# Patient Record
Sex: Female | Born: 1960 | Race: White | Hispanic: No | Marital: Married | Smoking: Never smoker
Health system: Southern US, Community
[De-identification: ages and names within clinical notes are randomized; demographics above are authoritative.]

## PROBLEM LIST (undated history)

## (undated) DIAGNOSIS — E079 Disorder of thyroid, unspecified: Secondary | ICD-10-CM

## (undated) DIAGNOSIS — Z8619 Personal history of other infectious and parasitic diseases: Secondary | ICD-10-CM

## (undated) DIAGNOSIS — I10 Essential (primary) hypertension: Secondary | ICD-10-CM

## (undated) DIAGNOSIS — T7840XA Allergy, unspecified, initial encounter: Secondary | ICD-10-CM

## (undated) HISTORY — DX: Allergy, unspecified, initial encounter: T78.40XA

## (undated) HISTORY — DX: Disorder of thyroid, unspecified: E07.9

## (undated) HISTORY — PX: TUBAL LIGATION: SHX77

## (undated) HISTORY — DX: Personal history of other infectious and parasitic diseases: Z86.19

## (undated) HISTORY — DX: Essential (primary) hypertension: I10

## (undated) HISTORY — PX: MOUTH SURGERY: SHX715

---

## 1998-10-01 ENCOUNTER — Other Ambulatory Visit: Admission: RE | Admit: 1998-10-01 | Discharge: 1998-10-01 | Payer: Self-pay | Admitting: Gynecology

## 1999-05-05 ENCOUNTER — Other Ambulatory Visit: Admission: RE | Admit: 1999-05-05 | Discharge: 1999-05-05 | Payer: Self-pay | Admitting: Gynecology

## 1999-05-05 ENCOUNTER — Encounter (INDEPENDENT_AMBULATORY_CARE_PROVIDER_SITE_OTHER): Payer: Self-pay | Admitting: Specialist

## 1999-10-05 ENCOUNTER — Other Ambulatory Visit: Admission: RE | Admit: 1999-10-05 | Discharge: 1999-10-05 | Payer: Self-pay | Admitting: Gynecology

## 2000-09-21 ENCOUNTER — Encounter: Payer: Self-pay | Admitting: Gynecology

## 2000-09-21 ENCOUNTER — Ambulatory Visit (HOSPITAL_COMMUNITY): Admission: RE | Admit: 2000-09-21 | Discharge: 2000-09-21 | Payer: Self-pay | Admitting: Gynecology

## 2000-10-21 ENCOUNTER — Other Ambulatory Visit: Admission: RE | Admit: 2000-10-21 | Discharge: 2000-10-21 | Payer: Self-pay | Admitting: Gynecology

## 2001-10-03 ENCOUNTER — Encounter: Payer: Self-pay | Admitting: Gynecology

## 2001-10-03 ENCOUNTER — Ambulatory Visit (HOSPITAL_COMMUNITY): Admission: RE | Admit: 2001-10-03 | Discharge: 2001-10-03 | Payer: Self-pay | Admitting: Gynecology

## 2001-10-05 ENCOUNTER — Encounter: Payer: Self-pay | Admitting: Gynecology

## 2001-10-05 ENCOUNTER — Encounter: Admission: RE | Admit: 2001-10-05 | Discharge: 2001-10-05 | Payer: Self-pay | Admitting: Gynecology

## 2001-10-24 ENCOUNTER — Other Ambulatory Visit: Admission: RE | Admit: 2001-10-24 | Discharge: 2001-10-24 | Payer: Self-pay | Admitting: Gynecology

## 2002-10-29 ENCOUNTER — Other Ambulatory Visit: Admission: RE | Admit: 2002-10-29 | Discharge: 2002-10-29 | Payer: Self-pay | Admitting: Gynecology

## 2003-04-05 ENCOUNTER — Other Ambulatory Visit: Admission: RE | Admit: 2003-04-05 | Discharge: 2003-04-05 | Payer: Self-pay | Admitting: Gynecology

## 2003-07-03 ENCOUNTER — Encounter: Payer: Self-pay | Admitting: Gynecology

## 2003-07-03 ENCOUNTER — Encounter: Admission: RE | Admit: 2003-07-03 | Discharge: 2003-07-03 | Payer: Self-pay | Admitting: Gynecology

## 2003-11-14 ENCOUNTER — Other Ambulatory Visit: Admission: RE | Admit: 2003-11-14 | Discharge: 2003-11-14 | Payer: Self-pay | Admitting: Gynecology

## 2004-03-17 ENCOUNTER — Encounter: Admission: RE | Admit: 2004-03-17 | Discharge: 2004-03-17 | Payer: Self-pay | Admitting: Gynecology

## 2004-07-06 ENCOUNTER — Encounter: Admission: RE | Admit: 2004-07-06 | Discharge: 2004-07-06 | Payer: Self-pay | Admitting: Gynecology

## 2004-11-17 ENCOUNTER — Other Ambulatory Visit: Admission: RE | Admit: 2004-11-17 | Discharge: 2004-11-17 | Payer: Self-pay | Admitting: Gynecology

## 2005-01-05 ENCOUNTER — Other Ambulatory Visit: Admission: RE | Admit: 2005-01-05 | Discharge: 2005-01-05 | Payer: Self-pay | Admitting: Diagnostic Radiology

## 2005-07-07 ENCOUNTER — Encounter: Admission: RE | Admit: 2005-07-07 | Discharge: 2005-07-07 | Payer: Self-pay | Admitting: Gynecology

## 2005-07-08 ENCOUNTER — Encounter: Admission: RE | Admit: 2005-07-08 | Discharge: 2005-07-08 | Payer: Self-pay | Admitting: Gynecology

## 2005-11-18 ENCOUNTER — Other Ambulatory Visit: Admission: RE | Admit: 2005-11-18 | Discharge: 2005-11-18 | Payer: Self-pay | Admitting: Gynecology

## 2006-07-12 ENCOUNTER — Encounter: Admission: RE | Admit: 2006-07-12 | Discharge: 2006-07-12 | Payer: Self-pay | Admitting: Gynecology

## 2006-07-19 ENCOUNTER — Encounter: Admission: RE | Admit: 2006-07-19 | Discharge: 2006-07-19 | Payer: Self-pay | Admitting: Gynecology

## 2006-12-12 ENCOUNTER — Other Ambulatory Visit: Admission: RE | Admit: 2006-12-12 | Discharge: 2006-12-12 | Payer: Self-pay | Admitting: Gynecology

## 2007-01-26 ENCOUNTER — Encounter: Admission: RE | Admit: 2007-01-26 | Discharge: 2007-01-26 | Payer: Self-pay | Admitting: Gynecology

## 2007-07-20 ENCOUNTER — Encounter: Admission: RE | Admit: 2007-07-20 | Discharge: 2007-07-20 | Payer: Self-pay | Admitting: Gynecology

## 2007-12-15 ENCOUNTER — Other Ambulatory Visit: Admission: RE | Admit: 2007-12-15 | Discharge: 2007-12-15 | Payer: Self-pay | Admitting: Gynecology

## 2008-07-30 ENCOUNTER — Encounter: Admission: RE | Admit: 2008-07-30 | Discharge: 2008-07-30 | Payer: Self-pay | Admitting: Gynecology

## 2009-02-10 ENCOUNTER — Ambulatory Visit: Payer: Self-pay | Admitting: Women's Health

## 2009-02-13 ENCOUNTER — Other Ambulatory Visit: Admission: RE | Admit: 2009-02-13 | Discharge: 2009-02-13 | Payer: Self-pay | Admitting: Gynecology

## 2009-02-13 ENCOUNTER — Encounter: Payer: Self-pay | Admitting: Women's Health

## 2009-02-13 ENCOUNTER — Ambulatory Visit: Payer: Self-pay | Admitting: Women's Health

## 2009-08-26 ENCOUNTER — Encounter: Admission: RE | Admit: 2009-08-26 | Discharge: 2009-08-26 | Payer: Self-pay | Admitting: Gynecology

## 2010-05-26 ENCOUNTER — Other Ambulatory Visit: Admission: RE | Admit: 2010-05-26 | Discharge: 2010-05-26 | Payer: Self-pay | Admitting: Gynecology

## 2010-05-26 ENCOUNTER — Ambulatory Visit: Payer: Self-pay | Admitting: Gynecology

## 2010-09-24 ENCOUNTER — Encounter: Admission: RE | Admit: 2010-09-24 | Discharge: 2010-09-24 | Payer: Self-pay | Admitting: Gynecology

## 2010-10-01 ENCOUNTER — Encounter: Admission: RE | Admit: 2010-10-01 | Discharge: 2010-10-01 | Payer: Self-pay | Admitting: Gynecology

## 2011-09-17 ENCOUNTER — Encounter: Payer: Self-pay | Admitting: Gynecology

## 2011-09-23 ENCOUNTER — Other Ambulatory Visit: Payer: Self-pay | Admitting: Gynecology

## 2011-09-23 DIAGNOSIS — Z1231 Encounter for screening mammogram for malignant neoplasm of breast: Secondary | ICD-10-CM

## 2011-09-28 ENCOUNTER — Encounter: Payer: Self-pay | Admitting: Gynecology

## 2011-09-28 ENCOUNTER — Ambulatory Visit (INDEPENDENT_AMBULATORY_CARE_PROVIDER_SITE_OTHER): Payer: Managed Care, Other (non HMO) | Admitting: Gynecology

## 2011-09-28 VITALS — BP 130/70 | Ht 65.5 in | Wt 168.0 lb

## 2011-09-28 DIAGNOSIS — Z13220 Encounter for screening for lipoid disorders: Secondary | ICD-10-CM

## 2011-09-28 DIAGNOSIS — R82998 Other abnormal findings in urine: Secondary | ICD-10-CM

## 2011-09-28 DIAGNOSIS — Z131 Encounter for screening for diabetes mellitus: Secondary | ICD-10-CM

## 2011-09-28 DIAGNOSIS — Z23 Encounter for immunization: Secondary | ICD-10-CM

## 2011-09-28 DIAGNOSIS — Z01419 Encounter for gynecological examination (general) (routine) without abnormal findings: Secondary | ICD-10-CM

## 2011-09-28 NOTE — Progress Notes (Signed)
Joyce Burns 1961/06/02 [id number]        50 y.o.  for annual exam.  Overall doing well but does note some night sweats. Having regular monthly menses.  Past medical history,surgical history, medications, allergies, family history and social history were all reviewed and documented in the EPIC chart. ROS:  Was performed and pertinent positives and negatives are included in the history.  Exam: chaperone present Filed Vitals:   09/28/11 1512  BP: 130/70   General appearance  Normal Skin grossly normal Head/Neck normal with no cervical or supraclavicular adenopathy thyroid normal Lungs  clear Cardiac RR, without RMG Abdominal  soft, nontender, without masses, organomegaly or hernia Breasts  examined lying and sitting without masses, retractions, discharge or axillary adenopathy. Pelvic  Ext/BUS/vagina  normal   Cervix  normal    Uterus  axial, normal size, shape and contour, midline and mobile nontender   Adnexa  Without masses or tenderness    Anus and perineum  normal   Rectovaginal  normal sphincter tone without palpated masses or tenderness.    Assessment/Plan:  50 y.o. female for annual exam.  Overall doing well. She is having some night sweats. She's having regular menses I doubt this is menopausal related. She is hypothyroid on Synthroid replacement and reports recently having blood work follow up and she is euthyroid.  SBE monthly reviewed. Due for mammogram now she knows to schedule this. No Pap smear was done today as she has no history of abnormal Pap smears, has a number of normal reports in her chart, the last Pap smear being July 2011. She's comfortable with less frequent screening interval and will plan every 3 years. Baseline labs to include CBC glucose lipid profile urinalysis were ordered. Assuming she continues well from a gynecologic standpoint, she will see [place] in a year, sooner as needed.    Dara Lords MD, 4:23 PM 09/28/2011

## 2011-09-29 ENCOUNTER — Encounter: Payer: Self-pay | Admitting: Gynecology

## 2011-10-19 ENCOUNTER — Ambulatory Visit
Admission: RE | Admit: 2011-10-19 | Discharge: 2011-10-19 | Disposition: A | Discharge status: Home / Self Care | Payer: Managed Care, Other (non HMO) | Source: Ambulatory Visit | Attending: Gynecology | Admitting: Gynecology

## 2011-10-19 DIAGNOSIS — Z1231 Encounter for screening mammogram for malignant neoplasm of breast: Secondary | ICD-10-CM

## 2011-10-25 ENCOUNTER — Other Ambulatory Visit: Payer: Self-pay | Admitting: *Deleted

## 2011-10-25 DIAGNOSIS — N63 Unspecified lump in unspecified breast: Secondary | ICD-10-CM

## 2011-10-25 DIAGNOSIS — R928 Other abnormal and inconclusive findings on diagnostic imaging of breast: Secondary | ICD-10-CM

## 2011-10-28 ENCOUNTER — Ambulatory Visit
Admission: RE | Admit: 2011-10-28 | Discharge: 2011-10-28 | Disposition: A | Discharge status: Home / Self Care | Payer: Managed Care, Other (non HMO) | Source: Ambulatory Visit | Attending: Gynecology | Admitting: Gynecology

## 2011-10-28 DIAGNOSIS — R928 Other abnormal and inconclusive findings on diagnostic imaging of breast: Secondary | ICD-10-CM

## 2011-10-28 DIAGNOSIS — N63 Unspecified lump in unspecified breast: Secondary | ICD-10-CM

## 2012-08-21 ENCOUNTER — Ambulatory Visit (INDEPENDENT_AMBULATORY_CARE_PROVIDER_SITE_OTHER): Payer: Managed Care, Other (non HMO) | Admitting: Gynecology

## 2012-08-21 ENCOUNTER — Encounter: Payer: Self-pay | Admitting: Gynecology

## 2012-08-21 DIAGNOSIS — R350 Frequency of micturition: Secondary | ICD-10-CM

## 2012-08-21 LAB — URINALYSIS W MICROSCOPIC + REFLEX CULTURE
Bilirubin Urine: NEGATIVE
Ketones, ur: NEGATIVE mg/dL
Nitrite: NEGATIVE
Protein, ur: NEGATIVE mg/dL
RBC / HPF: NONE SEEN RBC/hpf (ref <3)
Urobilinogen, UA: 0.2 mg/dL (ref 0.0–1.0)

## 2012-08-21 MED ORDER — SULFAMETHOXAZOLE-TRIMETHOPRIM 800-160 MG PO TABS: 1.0000 | ORAL_TABLET | Freq: Two times a day (BID) | ORAL | Status: DC

## 2012-08-21 NOTE — Progress Notes (Signed)
Patient presents with several day history of urinary frequency. No fever chills nausea vomiting diarrhea constipation. No urgency dysuria. Notes small amounts more frequently. She does have a family history of diabetes.  Exam with kim assistant Spine straight without CVA tenderness Abdomen soft nontender without masses guarding rebound organomegaly Pelvic external BUS vagina normal. Cervix normal. Uterus normal size midline mobile nontender. Adnexa without masses or tenderness.  Assessment and plan: Urinary frequency several day duration. UA shows concentrated urine negative glucose trace lysed blood where bacteria. We'll treat as an early urethritis with Septra DS one by mouth twice a day x3 days and pushing fluids. If symptoms persist she'll return.  Offered serum glucose screen but she declines and she is scheduled for her annual in November and she prefers to do her basic blood work then. Again she has concentrated urine with no glucose, frequent small voids which is not consistent with a diabetes picture.

## 2012-08-21 NOTE — Patient Instructions (Signed)
Push fluids to dilute your urine. Take antibiotics twice daily for 3 days. Follow up if your symptoms persist. Otherwise follow up in November for your annual exam.

## 2012-09-28 ENCOUNTER — Ambulatory Visit (INDEPENDENT_AMBULATORY_CARE_PROVIDER_SITE_OTHER): Payer: Managed Care, Other (non HMO) | Admitting: Gynecology

## 2012-09-28 ENCOUNTER — Encounter: Payer: Self-pay | Admitting: Gynecology

## 2012-09-28 VITALS — BP 150/90 | Ht 65.0 in | Wt 165.0 lb

## 2012-09-28 DIAGNOSIS — Z13220 Encounter for screening for lipoid disorders: Secondary | ICD-10-CM

## 2012-09-28 DIAGNOSIS — Z01419 Encounter for gynecological examination (general) (routine) without abnormal findings: Secondary | ICD-10-CM

## 2012-09-28 LAB — CBC WITH DIFFERENTIAL/PLATELET
Basophils Absolute: 0 10*3/uL (ref 0.0–0.1)
Basophils Relative: 0 % (ref 0–1)
HCT: 37.9 % (ref 36.0–46.0)
Hemoglobin: 12.8 g/dL (ref 12.0–15.0)
Lymphocytes Relative: 25 % (ref 12–46)
Monocytes Absolute: 0.6 10*3/uL (ref 0.1–1.0)
Monocytes Relative: 6 % (ref 3–12)
Neutro Abs: 6.4 10*3/uL (ref 1.7–7.7)
Neutrophils Relative %: 68 % (ref 43–77)
WBC: 9.4 10*3/uL (ref 4.0–10.5)

## 2012-09-28 LAB — LIPID PANEL
Cholesterol: 196 mg/dL (ref 0–200)
HDL: 48 mg/dL (ref >39)
LDL Cholesterol: 110 mg/dL — ABNORMAL HIGH (ref 0–99)
Triglycerides: 192 mg/dL — ABNORMAL HIGH (ref <150)
VLDL: 38 mg/dL (ref 0–40)

## 2012-09-28 LAB — COMPREHENSIVE METABOLIC PANEL
ALT: 9 U/L (ref 0–35)
BUN: 11 mg/dL (ref 6–23)
CO2: 25 mEq/L (ref 19–32)
Calcium: 9.5 mg/dL (ref 8.4–10.5)
Chloride: 101 mEq/L (ref 96–112)
Creat: 0.83 mg/dL (ref 0.50–1.10)
Total Bilirubin: 0.5 mg/dL (ref 0.3–1.2)

## 2012-09-28 IMAGING — US US BREAST R
1 series · 8 of 8 positions shown · non-contrast
Comparison: 10/19/2011 and earlier

CLINICAL DATA: The patient returns after screening study for
evaluation of the right breast.

DIGITAL DIAGNOSTIC RIGHT MAMMOGRAM  AND RIGHT BREAST ULTRASOUND:

[Series 1: us breast right · 8 of 8 slices shown]
[im 1/8]
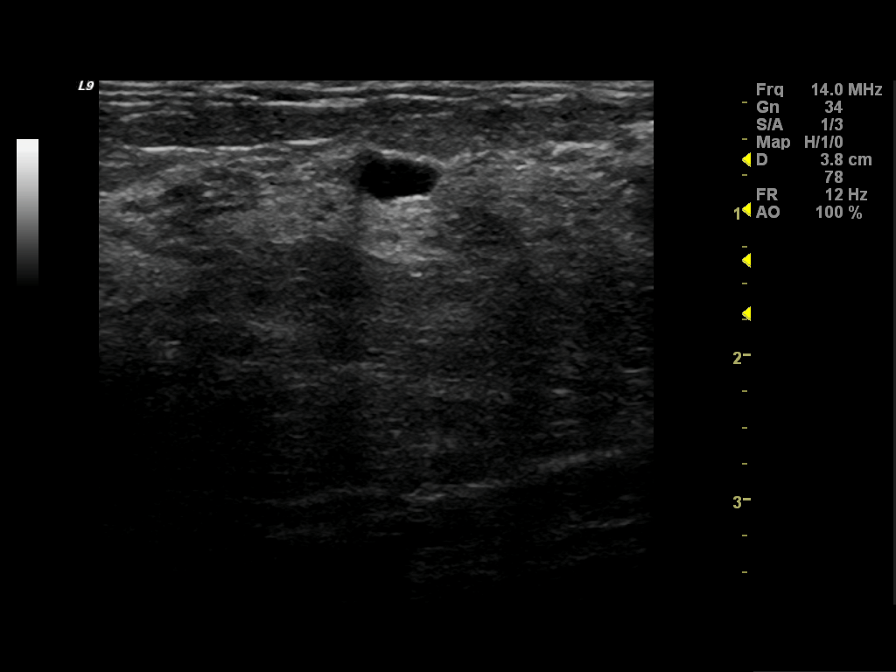
[im 2/8]
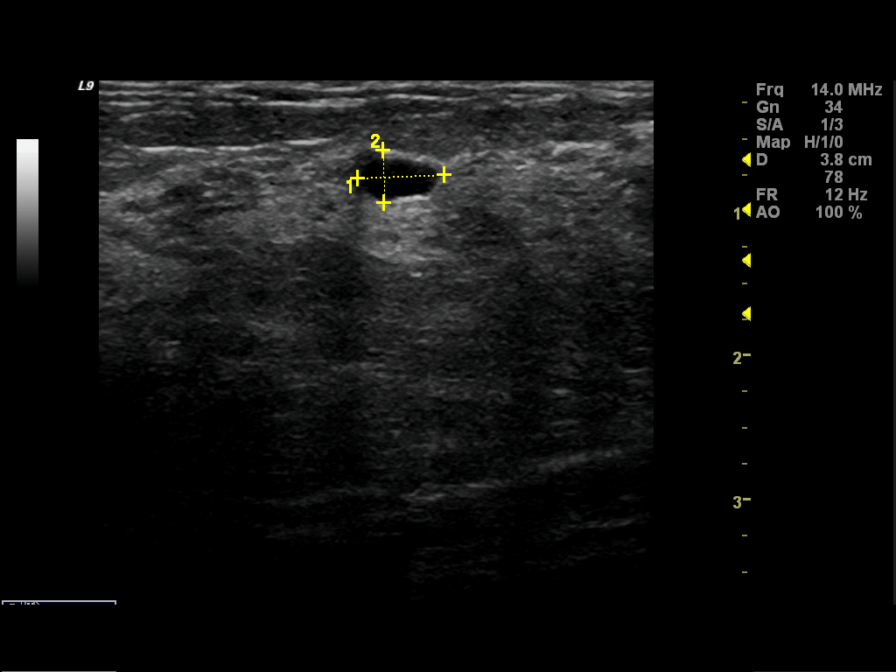
[im 3/8]
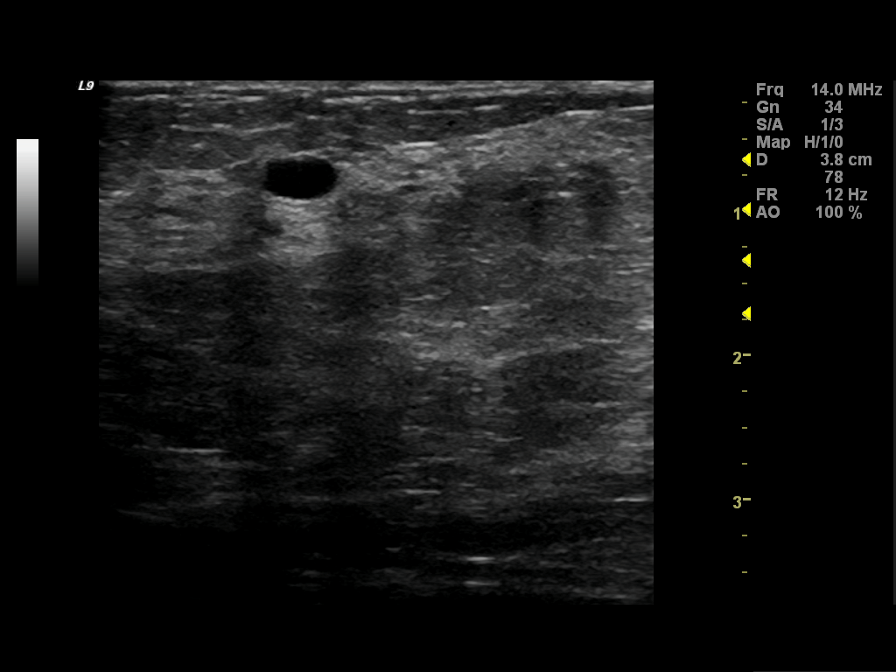
[im 4/8]
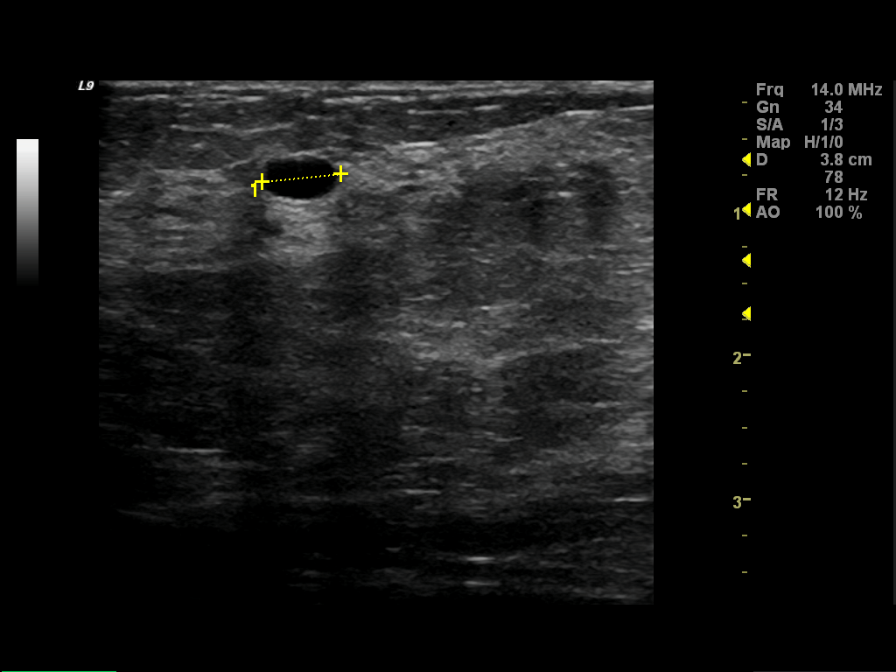
[im 5/8]
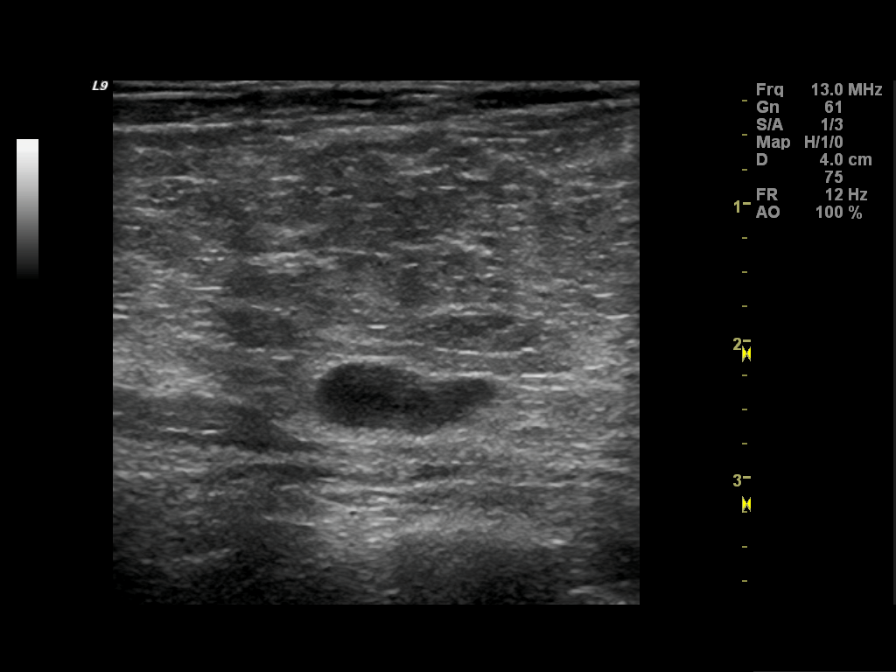
[im 6/8]
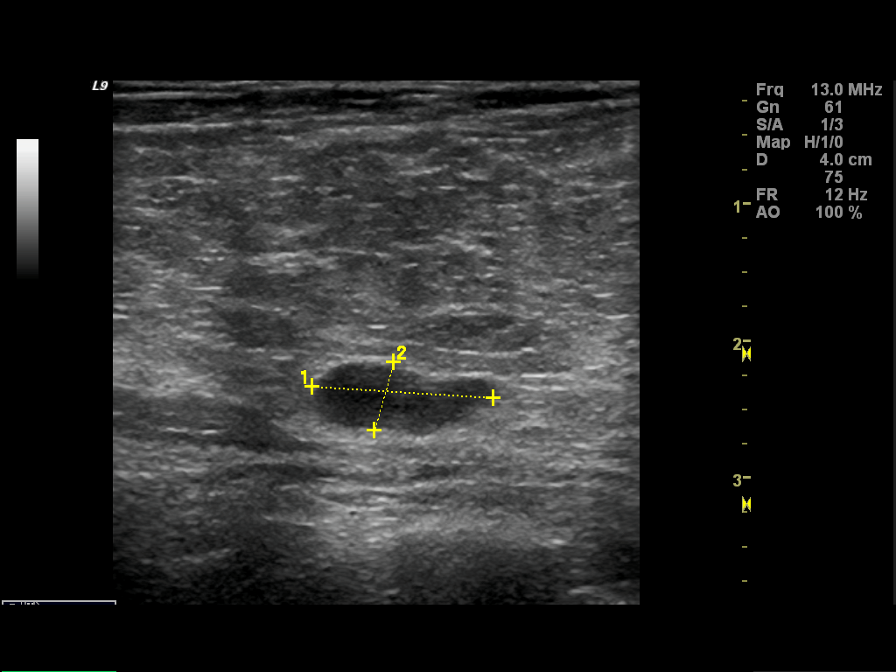
[im 7/8]
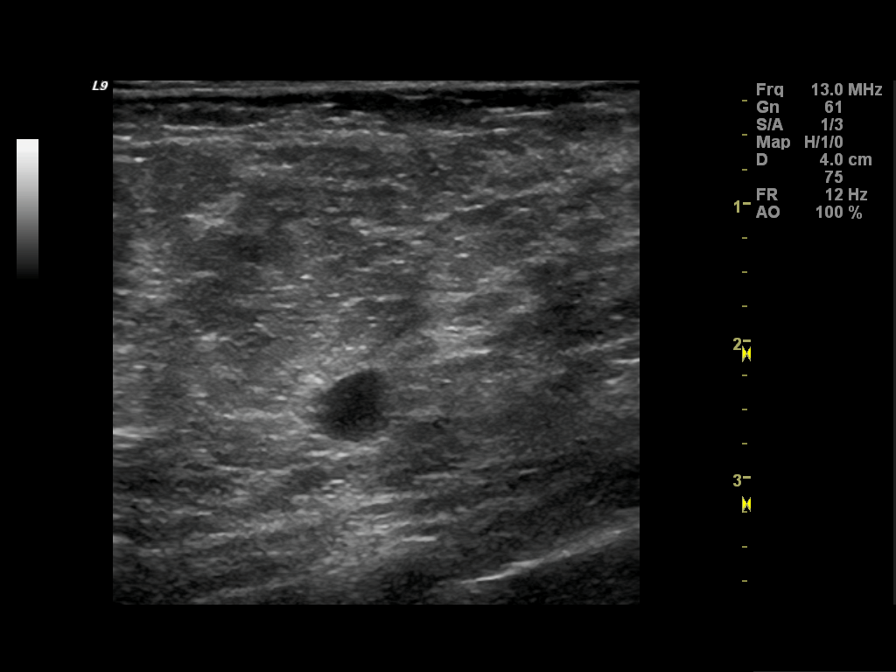
[im 8/8]
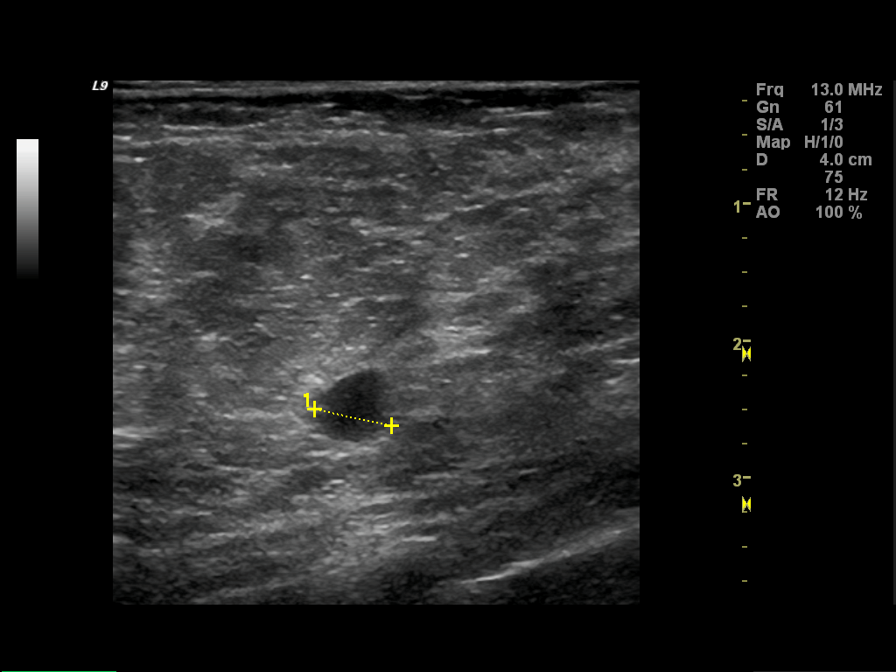

[8 of 8 positions shown; findings below may reference images not displayed]

FINDINGS: Spot compression views confirm presence of a
circumscribed oval nodule just lateral to the right nipple, best
seen on the oblique projection.

On physical exam, I palpate no abnormality lateral to the right
nipple.

Ultrasound is performed, showing a circumscribed cyst in the 8
o'clock location of the right breast 3 cm from the nipple.  This
measures 1.3 x 0.5 x 0.6 cm and is posterior, consistent with the
mammographic abnormality.  A more superficial cyst is also seen, in
the 11 o'clock location 3 cm from the nipple measuring 6 x 4 x 6
mm.  No solid mass or area of acoustic shadowing identified.
IMPRESSION: 1.  Persistent abnormality by mammogram represents a simple cyst by
ultrasound.
2. No mammographic or ultrasound evidence for malignancy.
3. Screening mammogram is recommended in one year.

BI-RADS CATEGORY 2:  Benign finding(s).

## 2012-09-28 NOTE — Progress Notes (Signed)
Joyce Burns [birth year] [id number]        51 y.o.  G2P2002 for annual exam.    Past medical history,surgical history, medications, allergies, family history and social history were all reviewed and documented in the EPIC chart. ROS:  Was performed and pertinent positives and negatives are included in the history.  Exam: Biomedical scientist Filed Vitals:   09/28/12 1424 09/28/12 1451  BP: 146/92 150/90  Height: 5\' 5"  (1.651 m)   Weight: 165 lb (74.844 kg)    General appearance  Normal Skin grossly normal Head/Neck normal with no cervical or supraclavicular adenopathy thyroid normal Lungs  clear Cardiac RR, without RMG Abdominal  soft, nontender, without masses, organomegaly or hernia Breasts  examined lying and sitting without masses, retractions, discharge or axillary adenopathy. Pelvic  Ext/BUS/vagina  normal   Cervix  normal   Uterus  anteverted, normal size, shape and contour, midline and mobile nontender   Adnexa  Without masses or tenderness    Anus and perineum  normal   Rectovaginal  normal sphincter tone without palpated masses or tenderness.    Assessment/Plan:  51 y.o. G20P2002 female for annual exam, regular menses, tubal sterilization.   1. Elevated blood pressure. Patient's blood pressure initially was 146/92. Recheck after exam 150/90. She does relate having it checked at home and is that it's normal. I have asked her to continue to monitor this and if it does remain elevated she will need to see her primary physician in follow up and possible treatment. 2. Mammography. Patients due in December and we'll schedule this. SBE monthly reviewed. 3. Pap smear. No Pap smear done today. Last Pap smear 2011. No history of abnormal Pap smears. Plan repeat next year at 3 year interval. 4. Health maintenance. Baseline CBC comprehensive metabolic panel lipid profile urinalysis ordered. She does have a history of low vitamin D in the past I rechecked it today. She'll continue see  Dr. Horald Pollen in follow up of her hypothyroidism. She'll see me in a year, sooner as needed.    Dara Lords MD, 3:20 PM 09/28/2012

## 2012-09-28 NOTE — Patient Instructions (Signed)
Follow up in one year for annual exam 

## 2012-09-29 ENCOUNTER — Encounter: Payer: Self-pay | Admitting: *Deleted

## 2012-09-29 LAB — URINALYSIS W MICROSCOPIC + REFLEX CULTURE
Casts: NONE SEEN
Crystals: NONE SEEN
Glucose, UA: NEGATIVE mg/dL
Leukocytes, UA: NEGATIVE
Nitrite: NEGATIVE
Specific Gravity, Urine: 1.015 (ref 1.005–1.030)
pH: 5 (ref 5.0–8.0)

## 2012-09-29 LAB — VITAMIN D 25 HYDROXY (VIT D DEFICIENCY, FRACTURES): Vit D, 25-Hydroxy: 27 ng/mL — ABNORMAL LOW (ref 30–89)

## 2012-11-21 ENCOUNTER — Other Ambulatory Visit: Payer: Self-pay | Admitting: Gynecology

## 2012-11-21 DIAGNOSIS — Z1231 Encounter for screening mammogram for malignant neoplasm of breast: Secondary | ICD-10-CM

## 2012-12-18 ENCOUNTER — Ambulatory Visit
Admission: RE | Admit: 2012-12-18 | Discharge: 2012-12-18 | Disposition: A | Discharge status: Home / Self Care | Payer: Managed Care, Other (non HMO) | Source: Ambulatory Visit | Attending: Gynecology | Admitting: Gynecology

## 2012-12-18 DIAGNOSIS — Z1231 Encounter for screening mammogram for malignant neoplasm of breast: Secondary | ICD-10-CM

## 2014-01-07 ENCOUNTER — Other Ambulatory Visit: Payer: Self-pay

## 2014-01-07 DIAGNOSIS — Z1231 Encounter for screening mammogram for malignant neoplasm of breast: Secondary | ICD-10-CM

## 2014-01-14 ENCOUNTER — Encounter: Payer: Self-pay | Admitting: General Surgery

## 2014-02-18 ENCOUNTER — Ambulatory Visit
Admission: RE | Admit: 2014-02-18 | Discharge: 2014-02-18 | Disposition: A | Discharge status: Home / Self Care | Payer: Managed Care, Other (non HMO) | Source: Ambulatory Visit

## 2014-02-18 DIAGNOSIS — Z1231 Encounter for screening mammogram for malignant neoplasm of breast: Secondary | ICD-10-CM

## 2014-02-20 ENCOUNTER — Other Ambulatory Visit: Payer: Self-pay | Admitting: Gynecology

## 2014-02-20 DIAGNOSIS — R928 Other abnormal and inconclusive findings on diagnostic imaging of breast: Secondary | ICD-10-CM

## 2014-03-01 ENCOUNTER — Ambulatory Visit
Admission: RE | Admit: 2014-03-01 | Discharge: 2014-03-01 | Disposition: A | Discharge status: Home / Self Care | Payer: Managed Care, Other (non HMO) | Source: Ambulatory Visit | Attending: Gynecology | Admitting: Gynecology

## 2014-03-01 DIAGNOSIS — R928 Other abnormal and inconclusive findings on diagnostic imaging of breast: Secondary | ICD-10-CM

## 2014-07-04 ENCOUNTER — Ambulatory Visit (INDEPENDENT_AMBULATORY_CARE_PROVIDER_SITE_OTHER): Payer: Managed Care, Other (non HMO) | Admitting: Gynecology

## 2014-07-04 ENCOUNTER — Encounter: Payer: Self-pay | Admitting: Gynecology

## 2014-07-04 ENCOUNTER — Other Ambulatory Visit (HOSPITAL_COMMUNITY)
Admission: RE | Admit: 2014-07-04 | Discharge: 2014-07-04 | Disposition: A | Discharge status: Home / Self Care | Payer: Managed Care, Other (non HMO) | Source: Ambulatory Visit | Attending: Gynecology | Admitting: Gynecology

## 2014-07-04 VITALS — BP 130/84 | Ht 65.0 in | Wt 165.0 lb

## 2014-07-04 DIAGNOSIS — Z1151 Encounter for screening for human papillomavirus (HPV): Secondary | ICD-10-CM | POA: Diagnosis present

## 2014-07-04 DIAGNOSIS — N393 Stress incontinence (female) (male): Secondary | ICD-10-CM

## 2014-07-04 DIAGNOSIS — Z01419 Encounter for gynecological examination (general) (routine) without abnormal findings: Secondary | ICD-10-CM | POA: Insufficient documentation

## 2014-07-04 LAB — COMPREHENSIVE METABOLIC PANEL
ALT: 11 U/L (ref 0–35)
AST: 15 U/L (ref 0–37)
Albumin: 4.4 g/dL (ref 3.5–5.2)
Alkaline Phosphatase: 70 U/L (ref 39–117)
BILIRUBIN TOTAL: 0.7 mg/dL (ref 0.2–1.2)
BUN: 12 mg/dL (ref 6–23)
CHLORIDE: 102 meq/L (ref 96–112)
CO2: 25 meq/L (ref 19–32)
Calcium: 9.1 mg/dL (ref 8.4–10.5)
Creat: 0.91 mg/dL (ref 0.50–1.10)
Glucose, Bld: 110 mg/dL — ABNORMAL HIGH (ref 70–99)
Potassium: 4.7 mEq/L (ref 3.5–5.3)
SODIUM: 137 meq/L (ref 135–145)
TOTAL PROTEIN: 7.6 g/dL (ref 6.0–8.3)

## 2014-07-04 LAB — CBC WITH DIFFERENTIAL/PLATELET
Basophils Absolute: 0 10*3/uL (ref 0.0–0.1)
Basophils Relative: 0 % (ref 0–1)
EOS ABS: 0.1 10*3/uL (ref 0.0–0.7)
Eosinophils Relative: 1 % (ref 0–5)
HCT: 38.7 % (ref 36.0–46.0)
Hemoglobin: 13.1 g/dL (ref 12.0–15.0)
LYMPHS ABS: 1.6 10*3/uL (ref 0.7–4.0)
LYMPHS PCT: 22 % (ref 12–46)
MCH: 30.3 pg (ref 26.0–34.0)
MCHC: 33.9 g/dL (ref 30.0–36.0)
MCV: 89.4 fL (ref 78.0–100.0)
Monocytes Absolute: 0.4 10*3/uL (ref 0.1–1.0)
Monocytes Relative: 6 % (ref 3–12)
NEUTROS PCT: 71 % (ref 43–77)
Neutro Abs: 5 10*3/uL (ref 1.7–7.7)
PLATELETS: 247 10*3/uL (ref 150–400)
RBC: 4.33 MIL/uL (ref 3.87–5.11)
RDW: 13.3 % (ref 11.5–15.5)
WBC: 7.1 10*3/uL (ref 4.0–10.5)

## 2014-07-04 LAB — LIPID PANEL
CHOLESTEROL: 175 mg/dL (ref 0–200)
HDL: 44 mg/dL (ref >39)
LDL Cholesterol: 102 mg/dL — ABNORMAL HIGH (ref 0–99)
Total CHOL/HDL Ratio: 4 Ratio
Triglycerides: 145 mg/dL (ref <150)
VLDL: 29 mg/dL (ref 0–40)

## 2014-07-04 NOTE — Progress Notes (Signed)
Lavera GuiseFrances M Hamed 1961-04-12 [id number]        53 y.o.  G2P2002 for annual exam.  Several issues noted below.  Past medical history,surgical history, problem list, medications, allergies, family history and social history were all reviewed and documented as reviewed in the EPIC chart.  ROS:  12 system ROS performed with pertinent positives and negatives included in the history, assessment and plan.   Additional significant findings :  Stress incontinence   Exam: Kim assistant Filed Vitals:   07/04/14 0952  BP: 130/84  Height: 5\' 5"  (1.651 m)  Weight: 165 lb (74.844 kg)   General appearance:  Normal affect, orientation and appearance. Skin: Grossly normal HEENT: Without gross lesions.  No cervical or supraclavicular adenopathy. Thyroid normal.  Lungs:  Clear without wheezing, rales or rhonchi Cardiac: RR, without RMG Abdominal:  Soft, nontender, without masses, guarding, rebound, organomegaly or hernia Breasts:  Examined lying and sitting without masses, retractions, discharge or axillary adenopathy. Pelvic:  Ext/BUS/vagina normal   Cervix normal. Pap/HPV  Uterus anteverted, normal size, shape and contour, midline and mobile nontender   Adnexa  Without masses or tenderness    Anus and perineum  Normal   Rectovaginal  Normal sphincter tone without palpated masses or tenderness.    Assessment/Plan:  53 y.o. 632P2002 female for annual exam with regular menses, tubal sterilization.   1. Stress incontinence. Patient having classic symptoms of SUI noting loss of urine with laughing coughing sneezing particularly with full bladder. No urgency component. Check urinalysis. Reviewed options including Kegel exercises and surgery. Patient not interested in surgery at this time. Will followup if this becomes a more significant issue. 2. Continues with regular menses at age 53. No significant symptoms such as hot flushes or night sweats. Wants FSH checked just to see if she is starting to elevate.  Will keep menstrual calendar as long as regular or less frequent but regular menses will follow. If significant irregular bleeding or significant menopausal symptoms and will followup for this.  3. Pap smear 2011. Pap/HPV today. No history of significant abnormal Pap smears. Plan repeat at 3-5 year interval assuming this Pap smear is normal. 4. Mammography 02/2014. Continue with annual mammography. SBE monthly reviewed. 5. Colonoscopy not yet. Recommended she schedule screening colonoscopy and she agrees to do so. 6. Health maintenance. Sees Dr. Talmage NapBalan for her thyroid and hypertension. Baseline CBC comprehensive metabolic panel lipid profile urinalysis vitamin D FSH ordered. Followup in one year, sooner as needed.   Note: This document was prepared with digital dictation and possible smart phrase technology. Any transcriptional errors that result from this process are unintentional.   Dara LordsFONTAINE,Desiree Daise P MD, 10:23 AM 07/04/2014

## 2014-07-04 NOTE — Patient Instructions (Addendum)
Schedule colonoscopy with [facility] at [phone] or [facility] at [phone]    Kegel Exercises The goal of Kegel exercises is to isolate and exercise your pelvic floor muscles. These muscles act as a hammock that supports the rectum, vagina, small intestine, and uterus. As the muscles weaken, the hammock sags and these organs are displaced from their normal positions. Kegel exercises can strengthen your pelvic floor muscles and help you to improve bladder and bowel control, improve sexual response, and help reduce many problems and some discomfort during pregnancy. Kegel exercises can be done anywhere and at any time. HOW TO PERFORM KEGEL EXERCISES 1. Locate your pelvic floor muscles. To do this, squeeze (contract) the muscles that you use when you try to stop the flow of urine. You will feel a tightness in the vaginal area (women) and a tight lift in the rectal area (men and women). 2. When you begin, contract your pelvic muscles tight for 2-5 seconds, then relax them for 2-5 seconds. This is one set. Do 4-5 sets with a short pause in between. 3. Contract your pelvic muscles for 8-10 seconds, then relax them for 8-10 seconds. Do 4-5 sets. If you cannot contract your pelvic muscles for 8-10 seconds, try 5-7 seconds and work your way up to 8-10 seconds. Your goal is 4-5 sets of 10 contractions each day. Keep your stomach, buttocks, and legs relaxed during the exercises. Perform sets of both short and long contractions. Vary your positions. Perform these contractions 3-4 times per day. Perform sets while you are:   Lying in bed in the morning.  Standing at lunch.  Sitting in the late afternoon.  Lying in bed at night. You should do 40-50 contractions per day. Do not perform more Kegel exercises per day than recommended. Overexercising can cause muscle fatigue. Continue these exercises for for at least 15-20 weeks or as directed by your caregiver. Document  Released: 10/18/2012 Document Reviewed: 10/18/2012 [facility] Patient Information 2015 [place]. This information is not intended to replace advice given to you by your health care provider. Make sure you discuss any questions you have with your health care provider.   You may obtain a copy of any labs that were done today by logging onto MyChart as outlined in the instructions provided with your AVS (after visit summary). The office will not call with normal lab results but certainly if there are any significant abnormalities then we will contact you.   Health Maintenance, Female A healthy lifestyle and preventative care can promote health and wellness.  Maintain regular health, dental, and eye exams.  Eat a healthy diet. Foods like vegetables, fruits, whole grains, low-fat dairy products, and lean protein foods contain the nutrients you need without too many calories. Decrease your intake of foods high in solid fats, added sugars, and salt. Get information about a proper diet from your caregiver, if necessary.  Regular physical exercise is one of the most important things you can do for your health. Most adults should get at least 150 minutes of moderate-intensity exercise (any activity that increases your heart rate and causes you to sweat) each week. In addition, most adults need muscle-strengthening exercises on 2 or more days a week.   Maintain a healthy weight. The body mass index (BMI) is a screening tool to identify possible weight problems. It provides an estimate of body fat based on height and weight. Your caregiver can help determine your BMI, and can help you achieve or maintain a healthy weight.  For adults 20 years and older:  A BMI below 18.5 is considered underweight.  A BMI of 18.5 to 24.9 is normal.  A BMI of 25 to 29.9 is considered overweight.  A BMI of 30 and above is considered obese.  Maintain normal blood lipids and cholesterol by exercising and minimizing your  intake of saturated fat. Eat a balanced diet with plenty of fruits and vegetables. Blood tests for lipids and cholesterol should begin at age 20 and be repeated every 5 years. If your lipid or cholesterol levels are high, you are over 50, or you are a high risk for heart disease, you may need your cholesterol levels checked more frequently.Ongoing high lipid and cholesterol levels should be treated with medicines if diet and exercise are not effective.  If you smoke, find out from your caregiver how to quit. If you do not use tobacco, do not start.  Lung cancer screening is recommended for adults aged 8 80 years who are at high risk for developing lung cancer because of a history of smoking. Yearly low-dose computed tomography (CT) is recommended for people who have at least a 30-pack-year history of smoking and are a current smoker or have quit within the past 15 years. A pack year of smoking is smoking an average of 1 pack of cigarettes a day for 1 year (for example: 1 pack a day for 30 years or 2 packs a day for 15 years). Yearly screening should continue until the smoker has stopped smoking for at least 15 years. Yearly screening should also be stopped for people who develop a health problem that would prevent them from having lung cancer treatment.  If you are pregnant, do not drink alcohol. If you are breastfeeding, be very cautious about drinking alcohol. If you are not pregnant and choose to drink alcohol, do not exceed 1 drink per day. One drink is considered to be 12 ounces (355 mL) of beer, 5 ounces (148 mL) of wine, or 1.5 ounces (44 mL) of liquor.  Avoid use of street drugs. Do not share needles with anyone. Ask for help if you need support or instructions about stopping the use of drugs.  High blood pressure causes heart disease and increases the risk of stroke. Blood pressure should be checked at least every 1 to 2 years. Ongoing high blood pressure should be treated with medicines, if  weight loss and exercise are not effective.  If you are 72 to 51 years old, ask your caregiver if you should take aspirin to prevent strokes.  Diabetes screening involves taking a blood sample to check your fasting blood sugar level. This should be done once every 3 years, after age 41, if you are within normal weight and without risk factors for diabetes. Testing should be considered at a younger age or be carried out more frequently if you are overweight and have at least 1 risk factor for diabetes.  Breast cancer screening is essential preventative care for women. You should practice "breast self-awareness." This means understanding the normal appearance and feel of your breasts and may include breast self-examination. Any changes detected, no matter how small, should be reported to a caregiver. Women in their 67s and 30s should have a clinical breast exam (CBE) by a caregiver as part of a regular health exam every 1 to 3 years. After age 69, women should have a CBE every year. Starting at age 50, women should consider having a mammogram (breast X-ray) every year. Women who  have a family history of breast cancer should talk to their caregiver about genetic screening. Women at a high risk of breast cancer should talk to their caregiver about having an MRI and a mammogram every year.  Breast cancer gene (BRCA)-related cancer risk assessment is recommended for women who have family members with BRCA-related cancers. BRCA-related cancers include breast, ovarian, tubal, and peritoneal cancers. Having family members with these cancers may be associated with an increased risk for harmful changes (mutations) in the breast cancer genes BRCA1 and BRCA2. Results of the assessment will determine the need for genetic counseling and BRCA1 and BRCA2 testing.  The Pap test is a screening test for cervical cancer. Women should have a Pap test starting at age 16. Between ages 21 and 77, Pap tests should be repeated every  2 years. Beginning at age 92, you should have a Pap test every 3 years as long as the past 3 Pap tests have been normal. If you had a hysterectomy for a problem that was not cancer or a condition that could lead to cancer, then you no longer need Pap tests. If you are between ages 8 and 65, and you have had normal Pap tests going back 10 years, you no longer need Pap tests. If you have had past treatment for cervical cancer or a condition that could lead to cancer, you need Pap tests and screening for cancer for at least 20 years after your treatment. If Pap tests have been discontinued, risk factors (such as a new sexual partner) need to be reassessed to determine if screening should be resumed. Some women have medical problems that increase the chance of getting cervical cancer. In these cases, your caregiver may recommend more frequent screening and Pap tests.  The human papillomavirus (HPV) test is an additional test that may be used for cervical cancer screening. The HPV test looks for the virus that can cause the cell changes on the cervix. The cells collected during the Pap test can be tested for HPV. The HPV test could be used to screen women aged 74 years and older, and should be used in women of any age who have unclear Pap test results. After the age of 35, women should have HPV testing at the same frequency as a Pap test.  Colorectal cancer can be detected and often prevented. Most routine colorectal cancer screening begins at the age of 47 and continues through age 69. However, your caregiver may recommend screening at an earlier age if you have risk factors for colon cancer. On a yearly basis, your caregiver may provide home test kits to check for hidden blood in the stool. Use of a small camera at the end of a tube, to directly examine the colon (sigmoidoscopy or colonoscopy), can detect the earliest forms of colorectal cancer. Talk to your caregiver about this at age 81, when routine screening  begins. Direct examination of the colon should be repeated every 5 to 10 years through age 54, unless early forms of pre-cancerous polyps or small growths are found.  Hepatitis C blood testing is recommended for all people born from [birth year] through 1963 and any individual with known risks for hepatitis C.  Practice safe sex. Use condoms and avoid high-risk sexual practices to reduce the spread of sexually transmitted infections (STIs). Sexually active women aged 56 and younger should be checked for Chlamydia, which is a common sexually transmitted infection. Older women with new or multiple partners should also be tested for Chlamydia.  Testing for other STIs is recommended if you are sexually active and at increased risk.  Osteoporosis is a disease in which the bones lose minerals and strength with aging. This can result in serious bone fractures. The risk of osteoporosis can be identified using a bone density scan. Women ages 63 and over and women at risk for fractures or osteoporosis should discuss screening with their caregivers. Ask your caregiver whether you should be taking a calcium supplement or vitamin D to reduce the rate of osteoporosis.  Menopause can be associated with physical symptoms and risks. Hormone replacement therapy is available to decrease symptoms and risks. You should talk to your caregiver about whether hormone replacement therapy is right for you.  Use sunscreen. Apply sunscreen liberally and repeatedly throughout the day. You should seek shade when your shadow is shorter than you. Protect yourself by wearing long sleeves, pants, a wide-brimmed hat, and sunglasses year round, whenever you are outdoors.  Notify your caregiver of new moles or changes in moles, especially if there is a change in shape or color. Also notify your caregiver if a mole is larger than the size of a pencil eraser.  Stay current with your immunizations. Document Released: 05/17/2011 Document Revised:  02/26/2013 Document Reviewed: 05/17/2011 [facility] Patient Information 2014 [place].

## 2014-07-04 NOTE — Addendum Note (Signed)
Addended by: Dayna BarkerGARDNER, KIMBERLY K on: 07/04/2014 10:41 AM   Modules accepted: Orders

## 2014-07-05 LAB — FOLLICLE STIMULATING HORMONE: FSH: 5.1 m[IU]/mL

## 2014-07-05 LAB — CYTOLOGY - PAP

## 2014-07-05 LAB — VITAMIN D 25 HYDROXY (VIT D DEFICIENCY, FRACTURES): VIT D 25 HYDROXY: 35 ng/mL (ref 30–89)

## 2014-07-08 ENCOUNTER — Other Ambulatory Visit: Payer: Self-pay | Admitting: *Deleted

## 2014-07-08 DIAGNOSIS — Z01419 Encounter for gynecological examination (general) (routine) without abnormal findings: Secondary | ICD-10-CM

## 2014-07-08 DIAGNOSIS — R739 Hyperglycemia, unspecified: Secondary | ICD-10-CM

## 2014-07-10 ENCOUNTER — Other Ambulatory Visit: Payer: Managed Care, Other (non HMO)

## 2014-07-10 DIAGNOSIS — Z01419 Encounter for gynecological examination (general) (routine) without abnormal findings: Secondary | ICD-10-CM

## 2014-07-10 DIAGNOSIS — R739 Hyperglycemia, unspecified: Secondary | ICD-10-CM

## 2014-07-10 LAB — HEMOGLOBIN A1C
Hgb A1c MFr Bld: 5.7 % — ABNORMAL HIGH (ref <5.7)
MEAN PLASMA GLUCOSE: 117 mg/dL — AB (ref <117)

## 2014-07-11 LAB — URINALYSIS W MICROSCOPIC + REFLEX CULTURE
BACTERIA UA: NONE SEEN
BILIRUBIN URINE: NEGATIVE
CASTS: NONE SEEN
CRYSTALS: NONE SEEN
GLUCOSE, UA: NEGATIVE mg/dL
KETONES UR: NEGATIVE mg/dL
Leukocytes, UA: NEGATIVE
NITRITE: NEGATIVE
PH: 7 (ref 5.0–8.0)
Protein, ur: NEGATIVE mg/dL
Specific Gravity, Urine: 1.006 (ref 1.005–1.030)
Squamous Epithelial / LPF: NONE SEEN
Urobilinogen, UA: 0.2 mg/dL (ref 0.0–1.0)

## 2014-07-11 LAB — GLUCOSE, FASTING: Glucose, Fasting: 99 mg/dL (ref 70–99)

## 2014-09-16 ENCOUNTER — Encounter: Payer: Self-pay | Admitting: Gynecology

## 2015-01-31 IMAGING — US US BREAST R
1 series · 6 of 6 positions shown · non-contrast
Comparison: Mammography 02/18/2014, 12/18/2012, dating back to
07/20/2007.

CLINICAL DATA: Callback from screening mammography with
tomosynthesis, possible right breast mass.

EXAM:
DIGITAL DIAGNOSTIC  RIGHT MAMMOGRAM
ULTRASOUND RIGHT BREAST

[Series 1: us breast right · 6 of 6 slices shown]
[im 1/6]
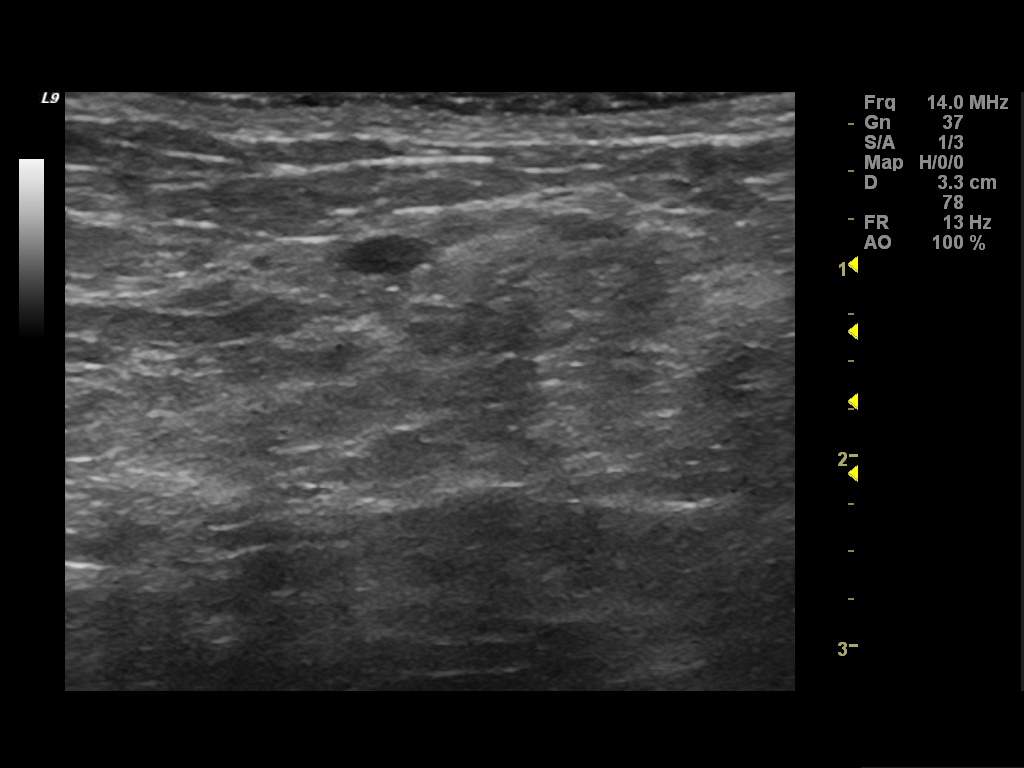
[im 2/6]
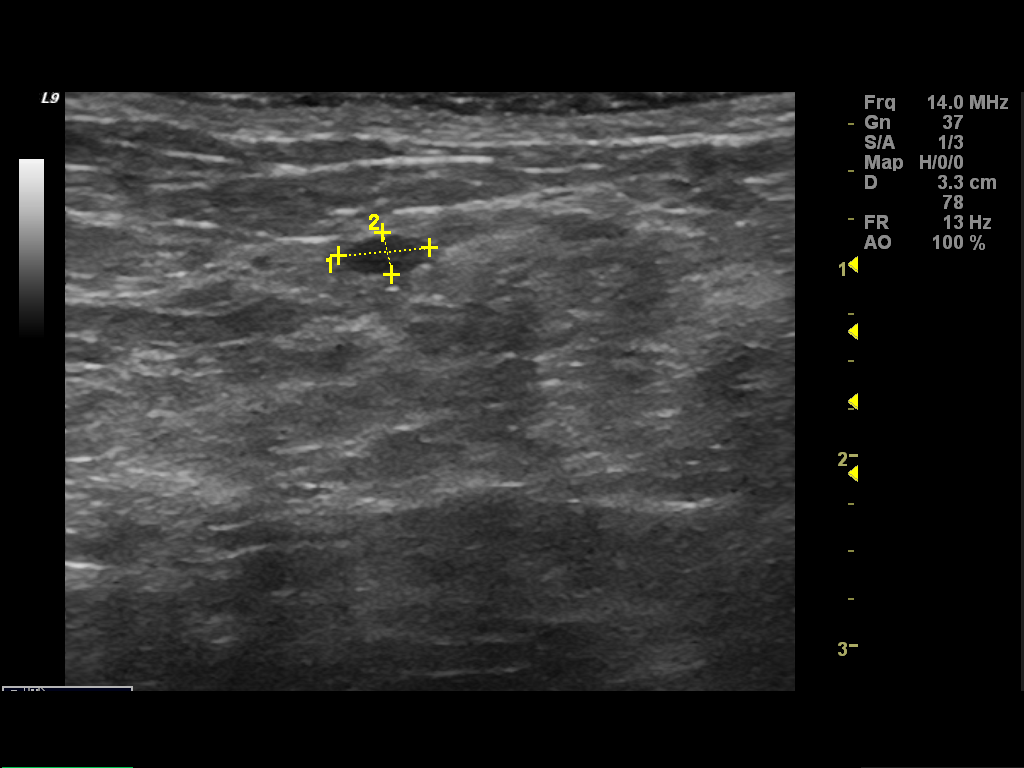
[im 3/6]
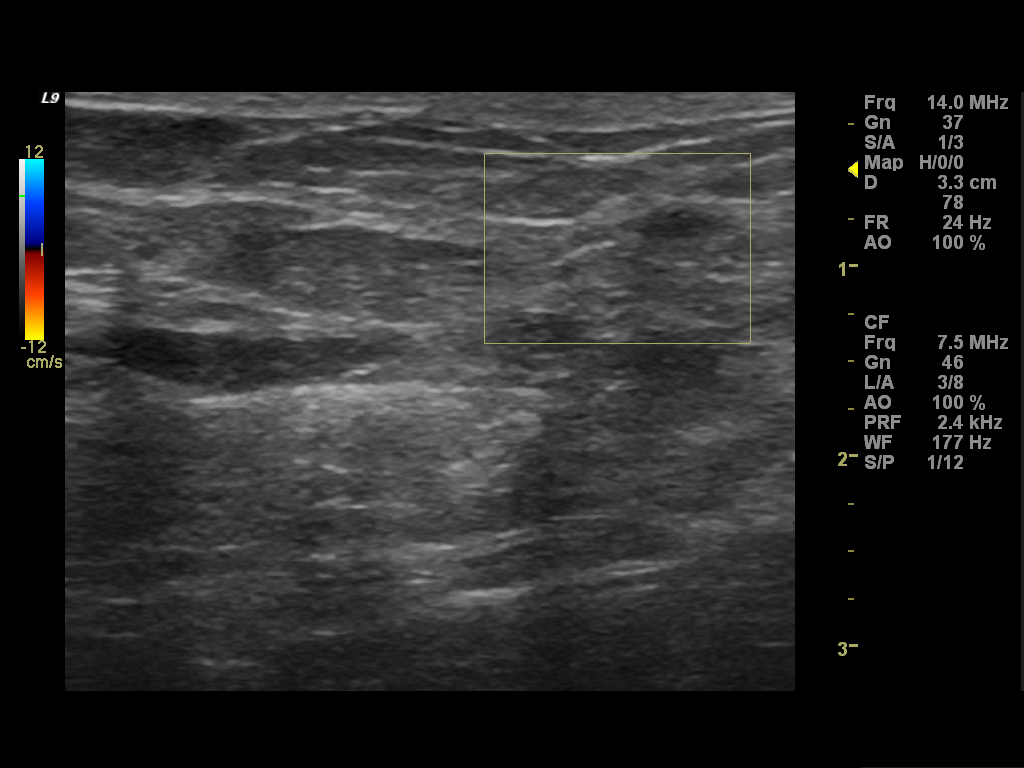
[im 4/6]
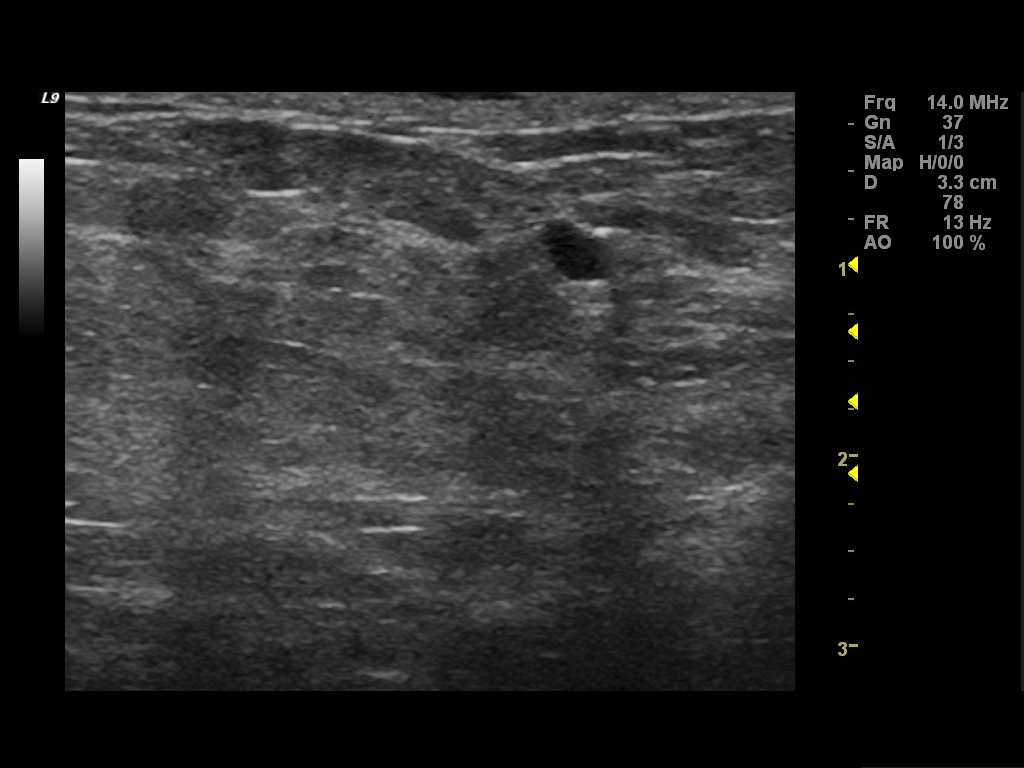
[im 5/6]
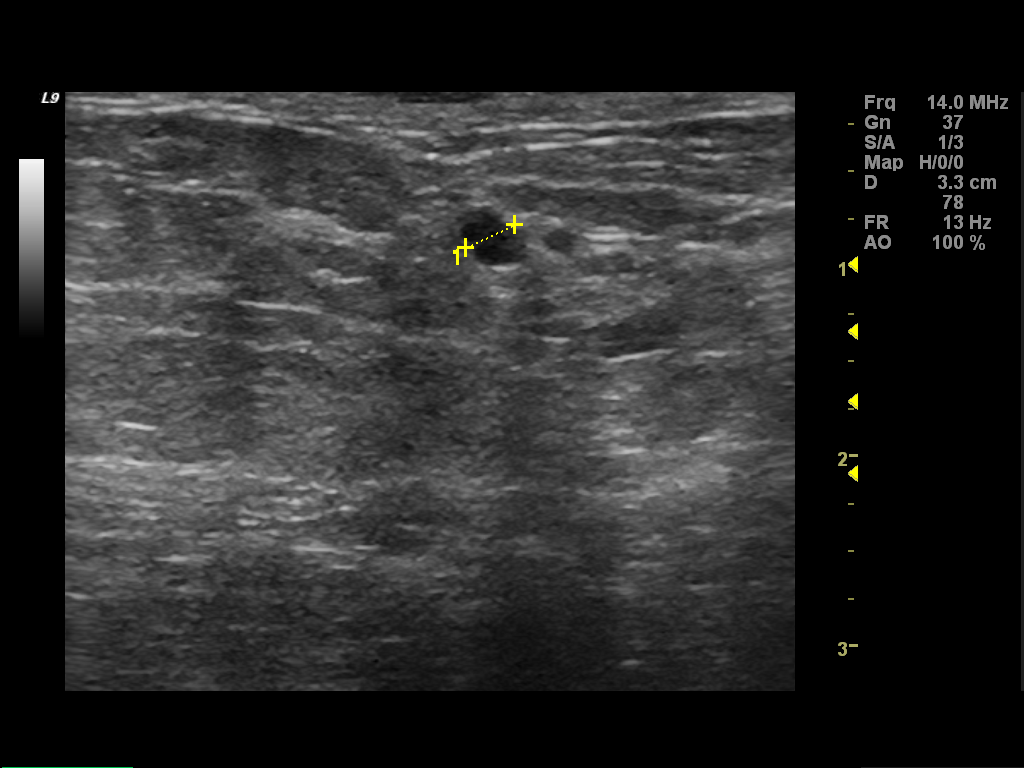
[im 6/6]
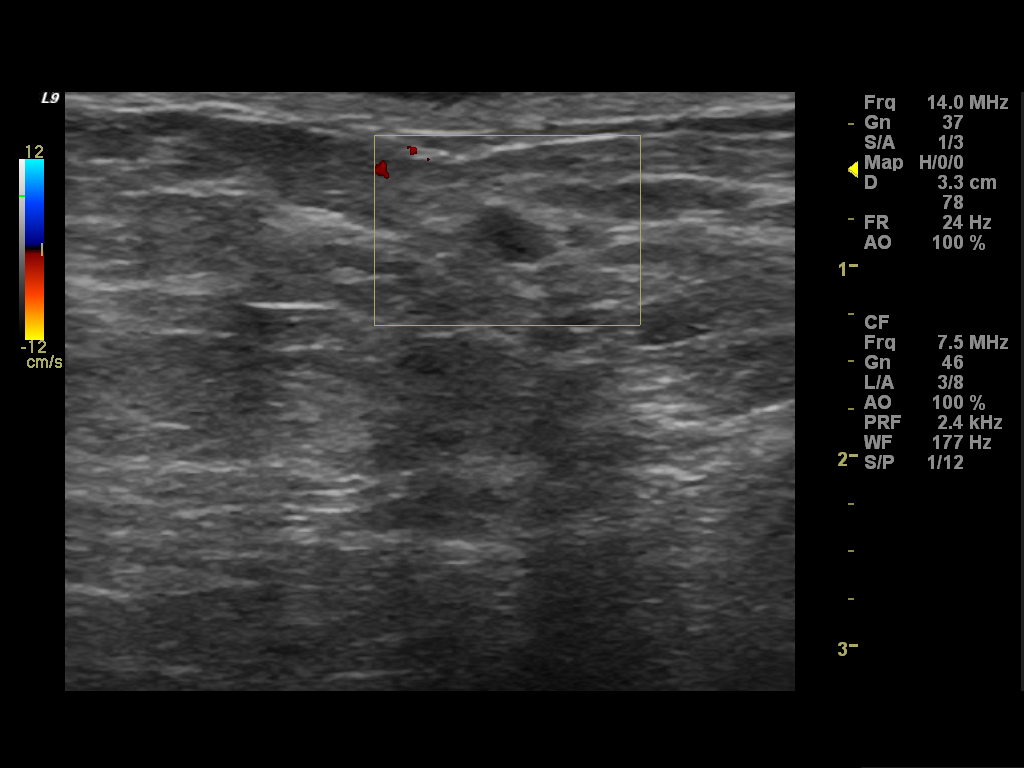

[6 of 6 positions shown; findings below may reference images not displayed]

Right breast ultrasound 10/28/2011 was performed in a
different part of the breast.

ACR Breast Density Category c: The breast tissue is heterogeneously
dense, which may obscure small masses.
FINDINGS: Spot compression CC and MLO views of the area of concern in the
lower inner right breast, anterior [DATE], were obtained. These confirm
a partially obscured mass without associated architectural
distortion or suspicious calcification.

On physical exam, there is no palpable abnormality in the lower
inner right breast.

Ultrasound is performed, showing an oval-shaped circumscribed
horizontally oriented nearly anechoic mass with acoustic enhancement
at the 6:30 o'clock position of the right breast approximately 5 cm
from the nipple measuring approximately 3 x 2 x 5 mm, corresponding
to the area of concern on the screening mammogram. There is no
internal color Doppler flow. No suspicious solid mass or abnormal
acoustic shadowing was identified.
IMPRESSION: Benign simple cyst in the lower and slightly inner right breast,
accounting for the area of concern on screening mammography with
tomosynthesis.

RECOMMENDATION:
Screening mammogram in one year.(Code:GS-M-HV7)

The importance of self breast examination was discussed with the
patient. I have discussed the findings and recommendations with the
patient. Results were also provided in writing at the conclusion of
the visit. If applicable, a reminder letter will be sent to the
patient regarding the next appointment.

BI-RADS CATEGORY  2: Benign.

## 2015-01-31 IMAGING — MG MM DIANOSTIC UNILATERAL R
2 series · 2 of 2 positions shown · non-contrast
Comparison: Mammography 02/18/2014, 12/18/2012, dating back to
07/20/2007.

CLINICAL DATA: Callback from screening mammography with
tomosynthesis, possible right breast mass.

EXAM:
DIGITAL DIAGNOSTIC  RIGHT MAMMOGRAM
ULTRASOUND RIGHT BREAST

[R MLO]
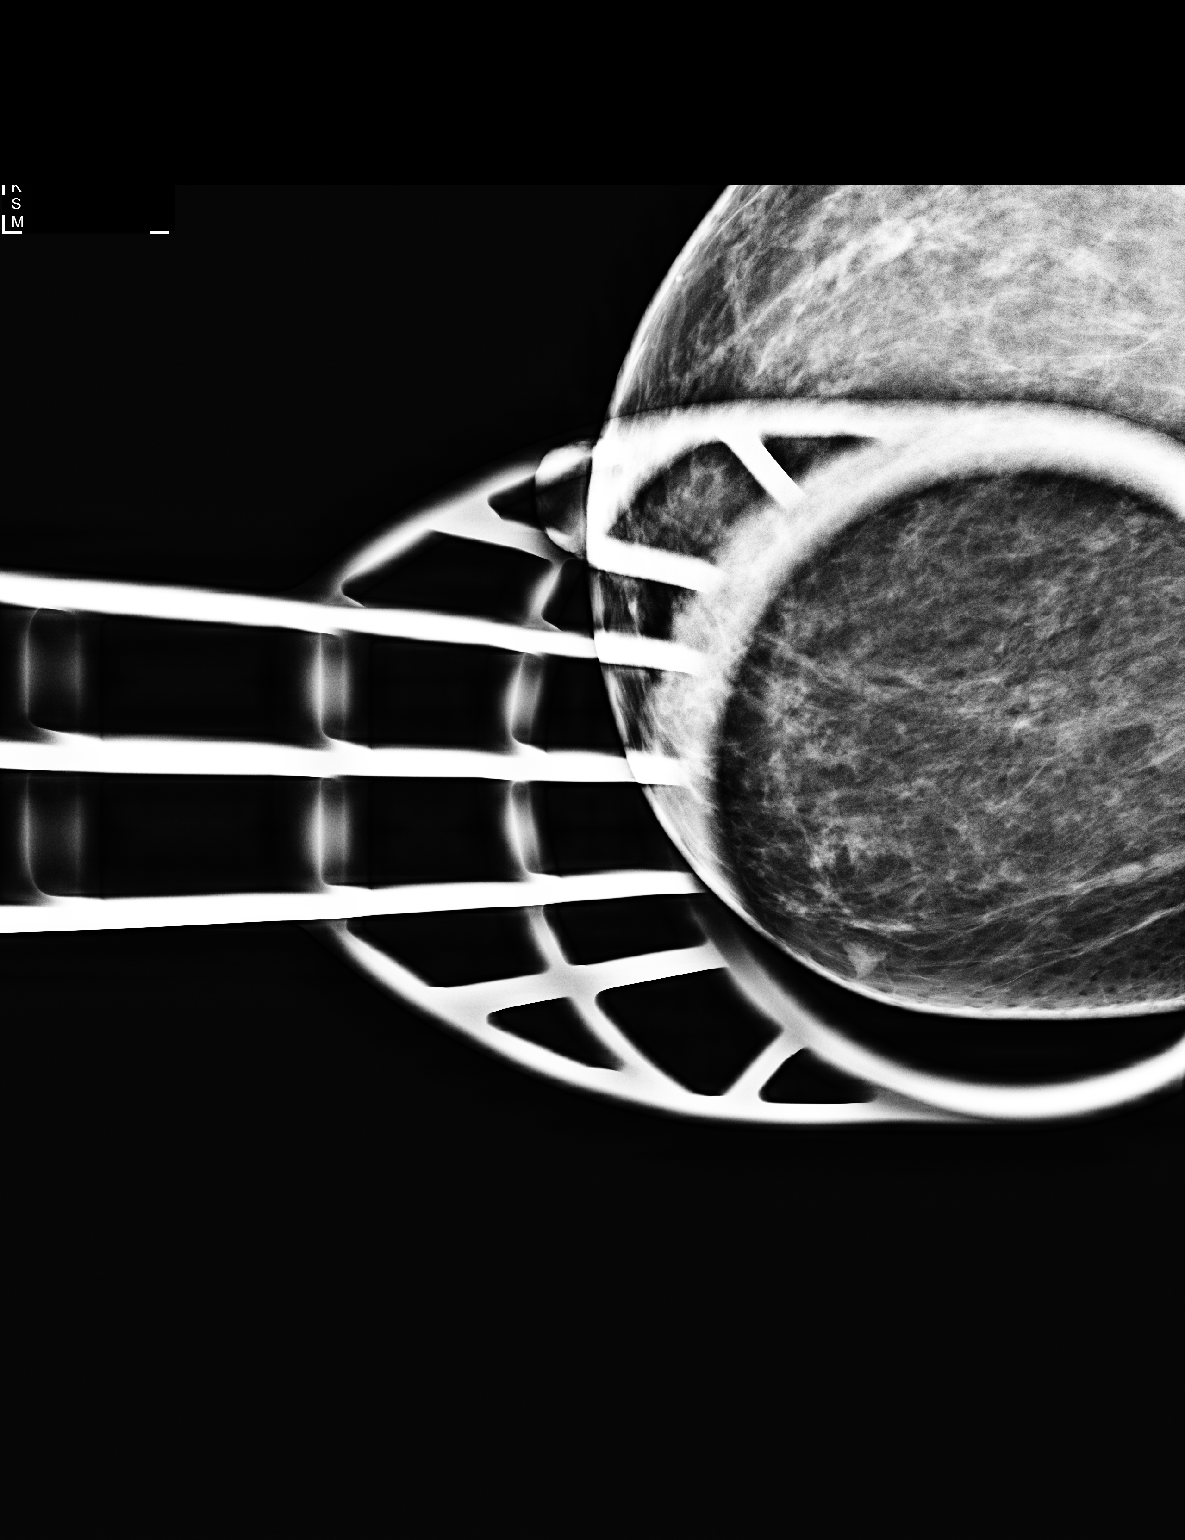

[R CC]
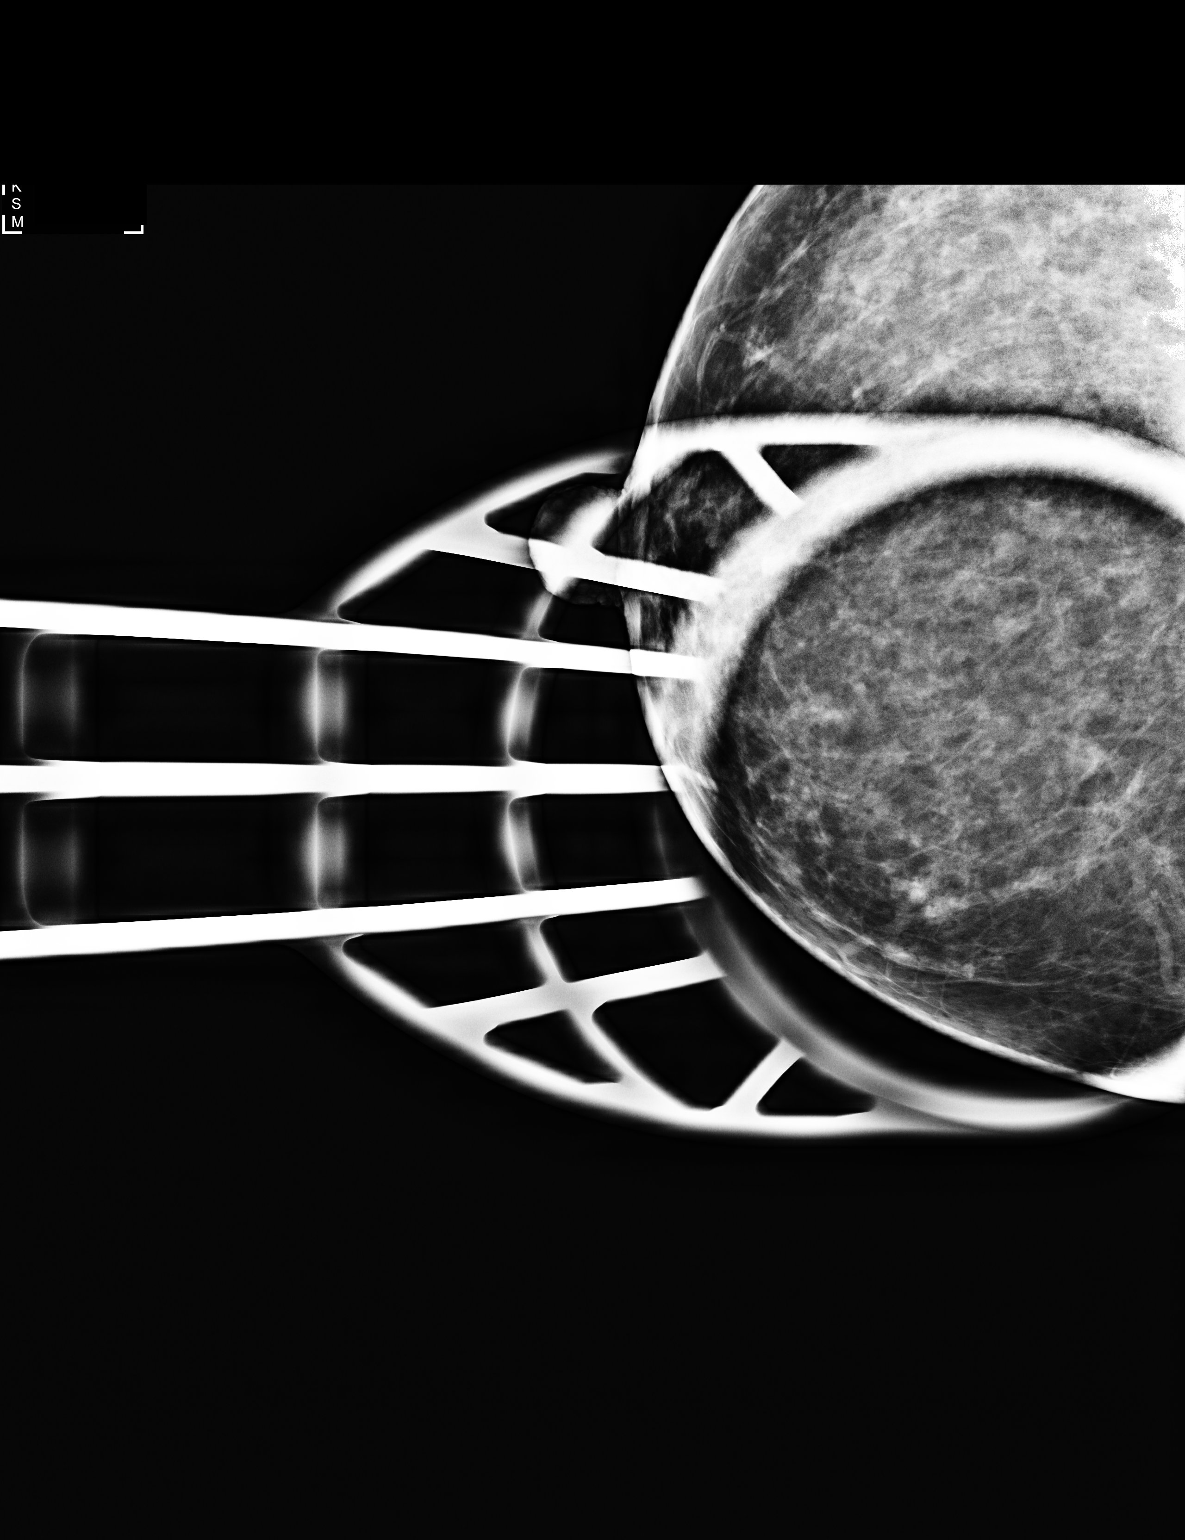

[2 of 2 positions shown; findings below may reference images not displayed]

Right breast ultrasound 10/28/2011 was performed in a
different part of the breast.

ACR Breast Density Category c: The breast tissue is heterogeneously
dense, which may obscure small masses.
FINDINGS: Spot compression CC and MLO views of the area of concern in the
lower inner right breast, anterior [DATE], were obtained. These confirm
a partially obscured mass without associated architectural
distortion or suspicious calcification.

On physical exam, there is no palpable abnormality in the lower
inner right breast.

Ultrasound is performed, showing an oval-shaped circumscribed
horizontally oriented nearly anechoic mass with acoustic enhancement
at the 6:30 o'clock position of the right breast approximately 5 cm
from the nipple measuring approximately 3 x 2 x 5 mm, corresponding
to the area of concern on the screening mammogram. There is no
internal color Doppler flow. No suspicious solid mass or abnormal
acoustic shadowing was identified.
IMPRESSION: Benign simple cyst in the lower and slightly inner right breast,
accounting for the area of concern on screening mammography with
tomosynthesis.

RECOMMENDATION:
Screening mammogram in one year.(Code:[id number]-[id number])

The importance of self breast examination was discussed with the
patient. I have discussed the findings and recommendations with the
patient. Results were also provided in writing at the conclusion of
the visit. If applicable, a reminder letter will be sent to the
patient regarding the next appointment.

BI-RADS CATEGORY  2: Benign.

## 2015-04-29 ENCOUNTER — Other Ambulatory Visit: Payer: Self-pay

## 2015-04-29 DIAGNOSIS — Z1231 Encounter for screening mammogram for malignant neoplasm of breast: Secondary | ICD-10-CM

## 2015-04-30 ENCOUNTER — Ambulatory Visit
Admission: RE | Admit: 2015-04-30 | Discharge: 2015-04-30 | Disposition: A | Discharge status: Home / Self Care | Payer: Managed Care, Other (non HMO) | Source: Ambulatory Visit

## 2015-04-30 DIAGNOSIS — Z1231 Encounter for screening mammogram for malignant neoplasm of breast: Secondary | ICD-10-CM

## 2015-05-02 ENCOUNTER — Other Ambulatory Visit: Payer: Self-pay | Admitting: Gynecology

## 2015-05-02 DIAGNOSIS — R928 Other abnormal and inconclusive findings on diagnostic imaging of breast: Secondary | ICD-10-CM

## 2015-05-06 ENCOUNTER — Ambulatory Visit
Admission: RE | Admit: 2015-05-06 | Discharge: 2015-05-06 | Disposition: A | Discharge status: Home / Self Care | Payer: Managed Care, Other (non HMO) | Source: Ambulatory Visit | Attending: Gynecology | Admitting: Gynecology

## 2015-05-06 DIAGNOSIS — R928 Other abnormal and inconclusive findings on diagnostic imaging of breast: Secondary | ICD-10-CM

## 2015-07-07 ENCOUNTER — Encounter: Payer: Managed Care, Other (non HMO) | Admitting: Gynecology

## 2015-08-14 ENCOUNTER — Ambulatory Visit (INDEPENDENT_AMBULATORY_CARE_PROVIDER_SITE_OTHER): Payer: Managed Care, Other (non HMO) | Admitting: Gynecology

## 2015-08-14 ENCOUNTER — Encounter: Payer: Self-pay | Admitting: Gynecology

## 2015-08-14 VITALS — BP 124/80 | Ht 65.0 in | Wt 157.0 lb

## 2015-08-14 DIAGNOSIS — Z23 Encounter for immunization: Secondary | ICD-10-CM

## 2015-08-14 DIAGNOSIS — Z01419 Encounter for gynecological examination (general) (routine) without abnormal findings: Secondary | ICD-10-CM

## 2015-08-14 LAB — LIPID PANEL
Cholesterol: 193 mg/dL (ref 125–200)
HDL: 52 mg/dL (ref >=46)
LDL CALC: 111 mg/dL (ref <130)
Total CHOL/HDL Ratio: 3.7 Ratio (ref <=5.0)
Triglycerides: 152 mg/dL — ABNORMAL HIGH (ref <150)
VLDL: 30 mg/dL (ref <30)

## 2015-08-14 NOTE — Patient Instructions (Signed)
Schedule your colonoscopy with either:  [facility]   Address: [address], [place], [place] [address]  [phone]    or  [facility]  Address: [address], [place], [place] [address]  [phone]      You may obtain a copy of any labs that were done today by logging onto MyChart as outlined in the instructions provided with your AVS (after visit summary). The office will not call with normal lab results but certainly if there are any significant abnormalities then we will contact you.   Health Maintenance Adopting a healthy lifestyle and getting preventive care can go a long way to promote health and wellness. Talk with your health care provider about what schedule of regular examinations is right for you. This is a good chance for you to check in with your provider about disease prevention and staying healthy. In between checkups, there are plenty of things you can do on your own. Experts have done a lot of research about which lifestyle changes and preventive measures are most likely to keep you healthy. Ask your health care provider for more information. WEIGHT AND DIET  Eat a healthy diet  Be sure to include plenty of vegetables, fruits, low-fat dairy products, and lean protein.  Do not eat a lot of foods high in solid fats, added sugars, or salt.  Get regular exercise. This is one of the most important things you can do for your health.  Most adults should exercise for at least 150 minutes each week. The exercise should increase your heart rate and make you sweat (moderate-intensity exercise).  Most adults should also do strengthening exercises at least twice a week. This is in addition to the moderate-intensity exercise.  Maintain a healthy weight  Body mass index (BMI) is a measurement that can be used to identify possible weight problems. It estimates body fat based on height and weight. Your health care provider can help determine  your BMI and help you achieve or maintain a healthy weight.  For females 22 years of age and older:   A BMI below 18.5 is considered underweight.  A BMI of 18.5 to 24.9 is normal.  A BMI of 25 to 29.9 is considered overweight.  A BMI of 30 and above is considered obese.  Watch levels of cholesterol and blood lipids  You should start having your blood tested for lipids and cholesterol at 52 years of age, then have this test every 5 years.  You may need to have your cholesterol levels checked more often if:  Your lipid or cholesterol levels are high.  You are older than 52 years of age.  You are at high risk for heart disease.  CANCER SCREENING   Lung Cancer  Lung cancer screening is recommended for adults 19-51 years old who are at high risk for lung cancer because of a history of smoking.  A yearly low-dose CT scan of the lungs is recommended for people who:  Currently smoke.  Have quit within the past 15 years.  Have at least a 30-pack-year history of smoking. A pack year is smoking an average of one pack of cigarettes a day for 1 year.  Yearly screening should continue until it has been 15 years since you quit.  Yearly screening should stop if you develop a health problem that would prevent you from having lung cancer treatment.  Breast Cancer  Practice breast self-awareness. This means understanding how your breasts normally appear and  feel.  It also means doing regular breast self-exams. Let your health care provider know about any changes, no matter how small.  If you are in your 20s or 30s, you should have a clinical breast exam (CBE) by a health care provider every 1-3 years as part of a regular health exam.  If you are 35 or older, have a CBE every year. Also consider having a breast X-ray (mammogram) every year.  If you have a family history of breast cancer, talk to your health care provider about genetic screening.  If you are at high risk for breast  cancer, talk to your health care provider about having an MRI and a mammogram every year.  Breast cancer gene (BRCA) assessment is recommended for women who have family members with BRCA-related cancers. BRCA-related cancers include:  Breast.  Ovarian.  Tubal.  Peritoneal cancers.  Results of the assessment will determine the need for genetic counseling and BRCA1 and BRCA2 testing. Cervical Cancer Routine pelvic examinations to screen for cervical cancer are no longer recommended for nonpregnant women who are considered low risk for cancer of the pelvic organs (ovaries, uterus, and vagina) and who do not have symptoms. A pelvic examination may be necessary if you have symptoms including those associated with pelvic infections. Ask your health care provider if a screening pelvic exam is right for you.   The Pap test is the screening test for cervical cancer for women who are considered at risk.  If you had a hysterectomy for a problem that was not cancer or a condition that could lead to cancer, then you no longer need Pap tests.  If you are older than 65 years, and you have had normal Pap tests for the past 10 years, you no longer need to have Pap tests.  If you have had past treatment for cervical cancer or a condition that could lead to cancer, you need Pap tests and screening for cancer for at least 20 years after your treatment.  If you no longer get a Pap test, assess your risk factors if they change (such as having a new sexual partner). This can affect whether you should start being screened again.  Some women have medical problems that increase their chance of getting cervical cancer. If this is the case for you, your health care provider may recommend more frequent screening and Pap tests.  The human papillomavirus (HPV) test is another test that may be used for cervical cancer screening. The HPV test looks for the virus that can cause cell changes in the cervix. The cells  collected during the Pap test can be tested for HPV.  The HPV test can be used to screen women 72 years of age and older. Getting tested for HPV can extend the interval between normal Pap tests from three to five years.  An HPV test also should be used to screen women of any age who have unclear Pap test results.  After 52 years of age, women should have HPV testing as often as Pap tests.  Colorectal Cancer  This type of cancer can be detected and often prevented.  Routine colorectal cancer screening usually begins at 53 years of age and continues through 52 years of age.  Your health care provider may recommend screening at an earlier age if you have risk factors for colon cancer.  Your health care provider may also recommend using home test kits to check for hidden blood in the stool.  A small camera  at the end of a tube can be used to examine your colon directly (sigmoidoscopy or colonoscopy). This is done to check for the earliest forms of colorectal cancer.  Routine screening usually begins at age 40.  Direct examination of the colon should be repeated every 5-10 years through 52 years of age. However, you may need to be screened more often if early forms of precancerous polyps or small growths are found. Skin Cancer  Check your skin from head to toe regularly.  Tell your health care provider about any new moles or changes in moles, especially if there is a change in a mole's shape or color.  Also tell your health care provider if you have a mole that is larger than the size of a pencil eraser.  Always use sunscreen. Apply sunscreen liberally and repeatedly throughout the day.  Protect yourself by wearing long sleeves, pants, a wide-brimmed hat, and sunglasses whenever you are outside. HEART DISEASE, DIABETES, AND HIGH BLOOD PRESSURE   Have your blood pressure checked at least every 1-2 years. High blood pressure causes heart disease and increases the risk of stroke.  If  you are between 74 years and 72 years old, ask your health care provider if you should take aspirin to prevent strokes.  Have regular diabetes screenings. This involves taking a blood sample to check your fasting blood sugar level.  If you are at a normal weight and have a low risk for diabetes, have this test once every three years after 52 years of age.  If you are overweight and have a high risk for diabetes, consider being tested at a younger age or more often. PREVENTING INFECTION  Hepatitis B  If you have a higher risk for hepatitis B, you should be screened for this virus. You are considered at high risk for hepatitis B if:  You were born in a country where hepatitis B is common. Ask your health care provider which countries are considered high risk.  Your parents were born in a high-risk country, and you have not been immunized against hepatitis B (hepatitis B vaccine).  You have HIV or AIDS.  You use needles to inject street drugs.  You live with someone who has hepatitis B.  You have had sex with someone who has hepatitis B.  You get hemodialysis treatment.  You take certain medicines for conditions, including cancer, organ transplantation, and autoimmune conditions. Hepatitis C  Blood testing is recommended for:  Everyone born from [birth year] through 1963.  Anyone with known risk factors for hepatitis C. Sexually transmitted infections (STIs)  You should be screened for sexually transmitted infections (STIs) including gonorrhea and chlamydia if:  You are sexually active and are younger than 52 years of age.  You are older than 52 years of age and your health care provider tells you that you are at risk for this type of infection.  Your sexual activity has changed since you were last screened and you are at an increased risk for chlamydia or gonorrhea. Ask your health care provider if you are at risk.  If you do not have HIV, but are at risk, it may be recommended that  you take a prescription medicine daily to prevent HIV infection. This is called pre-exposure prophylaxis (PrEP). You are considered at risk if:  You are sexually active and do not regularly use condoms or know the HIV status of your partner(s).  You take drugs by injection.  You are sexually active with a partner  who has HIV. Talk with your health care provider about whether you are at high risk of being infected with HIV. If you choose to begin PrEP, you should first be tested for HIV. You should then be tested every 3 months for as long as you are taking PrEP.  PREGNANCY   If you are premenopausal and you may become pregnant, ask your health care provider about preconception counseling.  If you may become pregnant, take 400 to 800 micrograms (mcg) of folic acid every day.  If you want to prevent pregnancy, talk to your health care provider about birth control (contraception). OSTEOPOROSIS AND MENOPAUSE   Osteoporosis is a disease in which the bones lose minerals and strength with aging. This can result in serious bone fractures. Your risk for osteoporosis can be identified using a bone density scan.  If you are 8 years of age or older, or if you are at risk for osteoporosis and fractures, ask your health care provider if you should be screened.  Ask your health care provider whether you should take a calcium or vitamin D supplement to lower your risk for osteoporosis.  Menopause may have certain physical symptoms and risks.  Hormone replacement therapy may reduce some of these symptoms and risks. Talk to your health care provider about whether hormone replacement therapy is right for you.  HOME CARE INSTRUCTIONS   Schedule regular health, dental, and eye exams.  Stay current with your immunizations.   Do not use any tobacco products including cigarettes, chewing tobacco, or electronic cigarettes.  If you are pregnant, do not drink alcohol.  If you are breastfeeding, limit how  much and how often you drink alcohol.  Limit alcohol intake to no more than 1 drink per day for nonpregnant women. One drink equals 12 ounces of beer, 5 ounces of wine, or 1 ounces of hard liquor.  Do not use street drugs.  Do not share needles.  Ask your health care provider for help if you need support or information about quitting drugs.  Tell your health care provider if you often feel depressed.  Tell your health care provider if you have ever been abused or do not feel safe at home. Document Released: 05/17/2011 Document Revised: 03/18/2014 Document Reviewed: 10/03/2013 [facility] Patient Information 2015 [place], [place]. This information is not intended to replace advice given to you by your health care provider. Make sure you discuss any questions you have with your health care provider.

## 2015-08-14 NOTE — Addendum Note (Signed)
Addended by: Dayna Barker on: 08/14/2015 03:12 PM   Modules accepted: Orders

## 2015-08-14 NOTE — Progress Notes (Signed)
Joyce Burns [birth year] [id number]        54 y.o.  G2P2002 for annual exam.  Several issues noted below.  Past medical history,surgical history, problem list, medications, allergies, family history and social history were all reviewed and documented as reviewed in the EPIC chart.  ROS:  Performed with pertinent positives and negatives included in the history, assessment and plan.   Additional significant findings :  none   Exam: [facility] Vitals:   08/14/15 1423  BP: 124/80  Height:  (1.651 m)  Weight: 157 lb (71.215 kg)   General appearance:  Normal affect, orientation and appearance. Skin: Grossly normal HEENT: Without gross lesions.  No cervical or supraclavicular adenopathy. Thyroid normal.  Lungs:  Clear without wheezing, rales or rhonchi Cardiac: RR, without RMG Abdominal:  Soft, nontender, without masses, guarding, rebound, organomegaly or hernia Breasts:  Examined lying and sitting without masses, retractions, discharge or axillary adenopathy. Pelvic:  Ext/BUS/vagina normal  Cervix normal  Uterus anteverted, normal size, shape and contour, midline and mobile nontender   Adnexa  Without masses or tenderness    Anus and perineum  Normal   Rectovaginal  Normal sphincter tone without palpated masses or tenderness.    Assessment/Plan:  54 y.o. G32P2002 female for annual exam with irregular menses, tubal sterilization.   1. Irregular menses. Patient is spacing out her periods with LMP May 2016. No prolonged or atypical bleeding. Having some mild hot flushes. Will keep menstrual calendar as long as less frequent but regular when they occur will monitor. If she goes more than one year without a period or has prolonged or atypical bleeding she knows to call. If menopausal symptoms worsen and she wants to discuss HRT she'll follow up for this. 2. Pap smear/HPV 2015 negative.  No Pap smear done today. No history of significant abnormal Pap smears. Repeat at 3-5 year  interval per current screening guidelines. 3. Mammography 04/2015. Continue with annual mammography. SBE monthly reviewed. 4. Colonoscopy never. I again recommended she schedule a screening colonoscopy and she agrees to do so. Names and numbers provided. 5. Health maintenance. Patient being followed by Dr. Joelene Millin. Clarene Duke. Has had all of her blood work with the exception of a lipid profile done. We'll go ahead and check lipid profile now. Follow up in one year, sooner as needed.   Dara Lords MD, 3:03 PM 08/14/2015

## 2015-08-15 LAB — URINALYSIS W MICROSCOPIC + REFLEX CULTURE
BACTERIA UA: NONE SEEN [HPF]
BILIRUBIN URINE: NEGATIVE
CRYSTALS: NONE SEEN [HPF]
Casts: NONE SEEN [LPF]
GLUCOSE, UA: NEGATIVE
HGB URINE DIPSTICK: NEGATIVE
KETONES UR: NEGATIVE
Leukocytes, UA: NEGATIVE
Nitrite: NEGATIVE
PROTEIN: NEGATIVE
RBC / HPF: NONE SEEN RBC/HPF (ref <=2)
Specific Gravity, Urine: 1.024 (ref 1.001–1.035)
WBC UA: NONE SEEN WBC/HPF (ref <=5)
Yeast: NONE SEEN [HPF]
pH: 5 (ref 5.0–8.0)

## 2015-09-08 ENCOUNTER — Telehealth: Payer: Self-pay | Admitting: *Deleted

## 2015-09-08 MED ORDER — MEGESTROL ACETATE 20 MG PO TABS: 20.0000 mg | ORAL_TABLET | Freq: Two times a day (BID) | ORAL | Status: DC

## 2015-09-08 NOTE — Telephone Encounter (Signed)
Pt aware Rx sent.  

## 2015-09-08 NOTE — Telephone Encounter (Signed)
Pt LMP May 2016, now on day 9 of bleeding, bright red blood wearing pad today only changed twice today. States last week she had to changed 4 times daily x 2 days, has slowed down, no pain. Recommendations? Please Francee Piccoloadivse

## 2015-09-08 NOTE — Telephone Encounter (Signed)
Megace 20 mg twice a day 5 days should resolve the bleeding.

## 2016-04-23 ENCOUNTER — Other Ambulatory Visit: Payer: Self-pay | Admitting: Gynecology

## 2016-04-23 DIAGNOSIS — Z1231 Encounter for screening mammogram for malignant neoplasm of breast: Secondary | ICD-10-CM

## 2016-05-10 ENCOUNTER — Ambulatory Visit
Admission: RE | Admit: 2016-05-10 | Discharge: 2016-05-10 | Disposition: A | Discharge status: Home / Self Care | Payer: Managed Care, Other (non HMO) | Source: Ambulatory Visit | Attending: Gynecology | Admitting: Gynecology

## 2016-05-10 DIAGNOSIS — Z1231 Encounter for screening mammogram for malignant neoplasm of breast: Secondary | ICD-10-CM

## 2016-08-16 ENCOUNTER — Encounter: Payer: Self-pay | Admitting: Gynecology

## 2016-08-16 ENCOUNTER — Ambulatory Visit (INDEPENDENT_AMBULATORY_CARE_PROVIDER_SITE_OTHER): Payer: Managed Care, Other (non HMO) | Admitting: Gynecology

## 2016-08-16 VITALS — BP 122/74 | Ht 65.0 in | Wt 168.0 lb

## 2016-08-16 DIAGNOSIS — Z23 Encounter for immunization: Secondary | ICD-10-CM | POA: Diagnosis not present

## 2016-08-16 DIAGNOSIS — Z01419 Encounter for gynecological examination (general) (routine) without abnormal findings: Secondary | ICD-10-CM

## 2016-08-16 DIAGNOSIS — N926 Irregular menstruation, unspecified: Secondary | ICD-10-CM

## 2016-08-16 LAB — CBC WITH DIFFERENTIAL/PLATELET
BASOS ABS: 0 {cells}/uL (ref 0–200)
BASOS PCT: 0 %
EOS PCT: 1 %
Eosinophils Absolute: 95 cells/uL (ref 15–500)
HCT: 38.8 % (ref 35.0–45.0)
HEMOGLOBIN: 12.8 g/dL (ref 11.7–15.5)
LYMPHS ABS: 2660 {cells}/uL (ref 850–3900)
Lymphocytes Relative: 28 %
MCH: 29.3 pg (ref 27.0–33.0)
MCHC: 33 g/dL (ref 32.0–36.0)
MCV: 88.8 fL (ref 80.0–100.0)
MPV: 11.9 fL (ref 7.5–12.5)
Monocytes Absolute: 665 cells/uL (ref 200–950)
Monocytes Relative: 7 %
NEUTROS ABS: 6080 {cells}/uL (ref 1500–7800)
Neutrophils Relative %: 64 %
PLATELETS: 245 10*3/uL (ref 140–400)
RBC: 4.37 MIL/uL (ref 3.80–5.10)
RDW: 13.4 % (ref 11.0–15.0)
WBC: 9.5 10*3/uL (ref 3.8–10.8)

## 2016-08-16 NOTE — Patient Instructions (Signed)
Follow up for ultrasound as scheduled 

## 2016-08-16 NOTE — Progress Notes (Signed)
    Joyce Burns 06-28-1961 [id number]        55 y.o.  G2P2002  for annual exam.  Several issues noted below.  Past medical history,surgical history, problem list, medications, allergies, family history and social history were all reviewed and documented as reviewed in the EPIC chart.  ROS:  Performed with pertinent positives and negatives included in the history, assessment and plan.   Additional significant findings :  None   Exam: Kennon PortelaKim Burns assistant Vitals:   08/16/16 1407  BP: 122/74  Weight: 168 lb (76.2 kg)  Height: 5\' 5"  (1.651 m)   Body mass index is 27.96 kg/m.  General appearance:  Normal affect, orientation and appearance. Skin: Grossly normal HEENT: Without gross lesions.  No cervical or supraclavicular adenopathy. Thyroid normal.  Lungs:  Clear without wheezing, rales or rhonchi Cardiac: RR, without RMG Abdominal:  Soft, nontender, without masses, guarding, rebound, organomegaly or hernia Breasts:  Examined lying and sitting without masses, retractions, discharge or axillary adenopathy. Pelvic:  Ext, BUS, Vagina normal with light menses flow  Cervix normal with light menses flow  Uterus anteverted with irregular menses, normal size, shape and contour, midline and mobile nontender   Adnexa without masses or tenderness    Anus and perineum normal   Rectovaginal normal sphincter tone without palpated masses or tenderness.    Assessment/Plan:  55 y.o. 262P2002 female for annual exam with irregular menses, tubal sterilization.   1. Irregular menses. Patient's menses have become more irregular this past year with skips times several months in between. When she does have bleeding she has very heavy bleeding with passage of clots, double protection, bleedthrough episodes. There are also lasting now 7-9 days with spotting before for up to a week. Will check CBC now and FSH. Relates having a recent TSH at Dr. Arlina Robesbalance office. Start with sonohysterogram to rule out  nonpalpable abnormalities like submucous myomas or endometrial polyps. Reviewed all this with the patient in the differential to include structural abnormalities versus hormonal dysfunction. Will follow up for the ultrasound and then we will go from there. 2. Pap smear/HPV thousand 15 negative. No Pap smear done today. No history of significant abnormal Pap smears previously. We will plan repeat Pap smear approaching 5 year interval per current screening guidelines. 3. Mammography 04/2016. Continue with annual mammography when due. SBE monthly reviewed. 4. Colonoscopy 2017. Repeat at their recommended interval. 5. Health maintenance. No routine blood work done as patient states that this is done elsewhere. Follow up for the ultrasound and lab results otherwise annual exam in one year.  Additional time in excess of her routine gynecologic exam was spent in direct face to face counseling and coordination of care in regards to her problems of irregular bleeding and  menorrhagia.    Dara LordsFONTAINE,Sehaj Mcenroe P MD, 2:47 PM 08/16/2016

## 2016-08-16 NOTE — Addendum Note (Signed)
Addended by: Dayna BarkerGARDNER, Joshua Soulier K on: 08/16/2016 04:23 PM   Modules accepted: Orders

## 2016-08-17 LAB — FOLLICLE STIMULATING HORMONE: FSH: 14.2 m[IU]/mL

## 2016-08-23 ENCOUNTER — Telehealth: Payer: Self-pay | Admitting: *Deleted

## 2016-08-23 NOTE — Telephone Encounter (Addendum)
I spoke with Maudie Mercury at [place] for benefits for [facility] scheduled for October 01, 2016  Codes 76856,76831,58340,58100, Codes 76856,58340,76831,58100 are covered at 80% after $3000 deductible is met which only $216.02 is met. Remaining 2783.98  879.56  Estimated amount due to [facility]

## 2016-08-24 ENCOUNTER — Telehealth: Payer: Self-pay | Admitting: *Deleted

## 2016-08-24 NOTE — Telephone Encounter (Signed)
Pt called requesting lab results on office visit 08/16/16 CBC and FSH. Left message on pt voicemail normal results.

## 2016-08-25 ENCOUNTER — Other Ambulatory Visit: Payer: Self-pay | Admitting: Gynecology

## 2016-08-25 DIAGNOSIS — N950 Postmenopausal bleeding: Secondary | ICD-10-CM

## 2016-09-02 ENCOUNTER — Other Ambulatory Visit: Payer: Managed Care, Other (non HMO)

## 2016-09-02 ENCOUNTER — Ambulatory Visit: Payer: Managed Care, Other (non HMO) | Admitting: Gynecology

## 2017-06-28 ENCOUNTER — Other Ambulatory Visit: Payer: Self-pay | Admitting: Gynecology

## 2017-06-28 DIAGNOSIS — Z1231 Encounter for screening mammogram for malignant neoplasm of breast: Secondary | ICD-10-CM

## 2017-07-06 ENCOUNTER — Ambulatory Visit
Admission: RE | Admit: 2017-07-06 | Discharge: 2017-07-06 | Disposition: A | Discharge status: Home / Self Care | Payer: Managed Care, Other (non HMO) | Source: Ambulatory Visit | Attending: Gynecology | Admitting: Gynecology

## 2017-07-06 DIAGNOSIS — Z1231 Encounter for screening mammogram for malignant neoplasm of breast: Secondary | ICD-10-CM

## 2017-11-25 ENCOUNTER — Encounter: Payer: Self-pay | Admitting: Gynecology

## 2017-11-25 ENCOUNTER — Ambulatory Visit (INDEPENDENT_AMBULATORY_CARE_PROVIDER_SITE_OTHER): Payer: Managed Care, Other (non HMO) | Admitting: Gynecology

## 2017-11-25 VITALS — BP 120/76 | Ht 65.0 in | Wt 170.0 lb

## 2017-11-25 DIAGNOSIS — Z01411 Encounter for gynecological examination (general) (routine) with abnormal findings: Secondary | ICD-10-CM | POA: Diagnosis not present

## 2017-11-25 DIAGNOSIS — N952 Postmenopausal atrophic vaginitis: Secondary | ICD-10-CM

## 2017-11-25 DIAGNOSIS — Z1321 Encounter for screening for nutritional disorder: Secondary | ICD-10-CM | POA: Diagnosis not present

## 2017-11-25 DIAGNOSIS — Z13220 Encounter for screening for lipoid disorders: Secondary | ICD-10-CM | POA: Diagnosis not present

## 2017-11-25 NOTE — Patient Instructions (Signed)
Follow-up in 1 year, sooner as needed. 

## 2017-11-25 NOTE — Progress Notes (Signed)
    Joyce Burns [birth year] [id number]        57 y.o.  [id number] for annual gynecologic exam.  Is without menses since June of last year with no bleeding at all.  Is having some hot flushes and night sweats.  Past medical history,surgical history, problem list, medications, allergies, family history and social history were all reviewed and documented as reviewed in the EPIC chart.  ROS:  Performed with pertinent positives and negatives included in the history, assessment and plan.   Additional significant findings : None   Exam: Kennon PortelaKim Gardner assistant Vitals:   11/25/17 1448  BP: 120/76  Weight: 170 lb (77.1 kg)  Height: 5\' 5"  (1.651 m)   Body mass index is 28.29 kg/m.  General appearance:  Normal affect, orientation and appearance. Skin: Grossly normal HEENT: Without gross lesions.  No cervical or supraclavicular adenopathy. Thyroid normal.  Lungs:  Clear without wheezing, rales or rhonchi Cardiac: RR, without RMG Abdominal:  Soft, nontender, without masses, guarding, rebound, organomegaly or hernia Breasts:  Examined lying and sitting without masses, retractions, discharge or axillary adenopathy. Pelvic:  Ext, BUS, Vagina: Normal with mild atrophic changes  Cervix: Normal with mild atrophic changes  Uterus: Anteverted, normal size, shape and contour, midline and mobile nontender   Adnexa: Without masses or tenderness    Anus and perineum: Normal   Rectovaginal: Normal sphincter tone without palpated masses or tenderness.    Assessment/Plan:  57 y.o. 402P2002 female for annual gynecologic exam.   1. Perimenopausal.  Last menstrual period approaching a year.  Is having some hot flushes and sweats.  Reviewed options with her up to and including HRT.  Patient is not interested in intervention but prefers just to monitor.  She will call if she has prolonged or atypical bleeding or goes more than 1 year without bleeding and then bleeds.  She will follow-up if she develops  significant menopausal symptoms and wants to rediscuss HRT. 2. Pap smear/HPV 2015.  No Pap smear done today.  No history of abnormal Pap smears.  Plan repeat Pap smear at 5-year interval per current screening guidelines. 3. Mammography 06/2017.  Continue with annual mammography when due.  Breast exam normal today. 4. Colonoscopy 2017.  Repeat at their recommended interval. 5. Health maintenance.  Reports having thyroid and diabetes screening elsewhere.  CBC, CMP, lipid profile, urine analysi and vitamin D ordered today.  Follow-up 1 year, sooner as needed.   Dara Lordsimothy P Fontaine MD, 3:41 PM 11/25/2017

## 2017-11-26 LAB — URINALYSIS W MICROSCOPIC + REFLEX CULTURE
BACTERIA UA: NONE SEEN /HPF
Bilirubin Urine: NEGATIVE
Glucose, UA: NEGATIVE
HGB URINE DIPSTICK: NEGATIVE
Hyaline Cast: NONE SEEN /LPF
KETONES UR: NEGATIVE
LEUKOCYTE ESTERASE: NEGATIVE
Nitrites, Initial: NEGATIVE
PROTEIN: NEGATIVE
RBC / HPF: NONE SEEN /HPF (ref 0–2)
Specific Gravity, Urine: 1.004 (ref 1.001–1.03)
WBC UA: NONE SEEN /HPF (ref 0–5)
pH: 5.5 (ref 5.0–8.0)

## 2017-11-26 LAB — LIPID PANEL
CHOL/HDL RATIO: 3.7 (calc) (ref <5.0)
CHOLESTEROL: 208 mg/dL — AB (ref <200)
HDL: 56 mg/dL (ref >50)
LDL CHOLESTEROL (CALC): 127 mg/dL — AB
Non-HDL Cholesterol (Calc): 152 mg/dL (calc) — ABNORMAL HIGH (ref <130)
TRIGLYCERIDES: 139 mg/dL (ref <150)

## 2017-11-26 LAB — CBC WITH DIFFERENTIAL/PLATELET
BASOS ABS: 44 {cells}/uL (ref 0–200)
BASOS PCT: 0.5 %
EOS ABS: 131 {cells}/uL (ref 15–500)
Eosinophils Relative: 1.5 %
HCT: 38 % (ref 35.0–45.0)
HEMOGLOBIN: 12.9 g/dL (ref 11.7–15.5)
Lymphs Abs: 2097 cells/uL (ref 850–3900)
MCH: 29.1 pg (ref 27.0–33.0)
MCHC: 33.9 g/dL (ref 32.0–36.0)
MCV: 85.6 fL (ref 80.0–100.0)
MPV: 12.4 fL (ref 7.5–12.5)
Monocytes Relative: 7.7 %
NEUTROS ABS: 5759 {cells}/uL (ref 1500–7800)
Neutrophils Relative %: 66.2 %
Platelets: 238 10*3/uL (ref 140–400)
RBC: 4.44 10*6/uL (ref 3.80–5.10)
RDW: 12.8 % (ref 11.0–15.0)
TOTAL LYMPHOCYTE: 24.1 %
WBC: 8.7 10*3/uL (ref 3.8–10.8)
WBCMIX: 670 {cells}/uL (ref 200–950)

## 2017-11-26 LAB — COMPREHENSIVE METABOLIC PANEL
AG Ratio: 1.5 (calc) (ref 1.0–2.5)
ALBUMIN MSPROF: 4.5 g/dL (ref 3.6–5.1)
ALKALINE PHOSPHATASE (APISO): 85 U/L (ref 33–130)
ALT: 16 U/L (ref 6–29)
AST: 16 U/L (ref 10–35)
BUN: 16 mg/dL (ref 7–25)
CO2: 27 mmol/L (ref 20–32)
CREATININE: 0.81 mg/dL (ref 0.50–1.05)
Calcium: 10 mg/dL (ref 8.6–10.4)
Chloride: 103 mmol/L (ref 98–110)
Globulin: 3.1 g/dL (calc) (ref 1.9–3.7)
Glucose, Bld: 98 mg/dL (ref 65–99)
POTASSIUM: 5.2 mmol/L (ref 3.5–5.3)
Sodium: 137 mmol/L (ref 135–146)
Total Bilirubin: 0.5 mg/dL (ref 0.2–1.2)
Total Protein: 7.6 g/dL (ref 6.1–8.1)

## 2017-11-26 LAB — VITAMIN D 25 HYDROXY (VIT D DEFICIENCY, FRACTURES): Vit D, 25-Hydroxy: 20 ng/mL — ABNORMAL LOW (ref 30–100)

## 2017-11-29 ENCOUNTER — Other Ambulatory Visit: Payer: Self-pay | Admitting: Gynecology

## 2017-11-29 DIAGNOSIS — E559 Vitamin D deficiency, unspecified: Secondary | ICD-10-CM

## 2017-11-29 MED ORDER — VITAMIN D (ERGOCALCIFEROL) 1.25 MG (50000 UNIT) PO CAPS: 50000.0000 [IU] | ORAL_CAPSULE | ORAL | 0 refills | Status: DC

## 2018-02-25 ENCOUNTER — Other Ambulatory Visit: Payer: Self-pay | Admitting: Gynecology

## 2018-03-09 ENCOUNTER — Other Ambulatory Visit: Payer: Managed Care, Other (non HMO)

## 2018-03-09 ENCOUNTER — Other Ambulatory Visit: Payer: Self-pay

## 2018-03-09 DIAGNOSIS — E559 Vitamin D deficiency, unspecified: Secondary | ICD-10-CM

## 2018-03-10 ENCOUNTER — Other Ambulatory Visit: Payer: Self-pay | Admitting: Gynecology

## 2018-03-10 DIAGNOSIS — E559 Vitamin D deficiency, unspecified: Secondary | ICD-10-CM

## 2018-03-10 LAB — VITAMIN D 25 HYDROXY (VIT D DEFICIENCY, FRACTURES): Vit D, 25-Hydroxy: 27 ng/mL — ABNORMAL LOW (ref 30–100)

## 2018-03-10 MED ORDER — VITAMIN D (ERGOCALCIFEROL) 1.25 MG (50000 UNIT) PO CAPS: 50000.0000 [IU] | ORAL_CAPSULE | ORAL | 0 refills | Status: DC

## 2018-05-25 ENCOUNTER — Other Ambulatory Visit: Payer: Self-pay | Admitting: Gynecology

## 2018-05-30 ENCOUNTER — Other Ambulatory Visit: Payer: Managed Care, Other (non HMO)

## 2018-05-30 DIAGNOSIS — E559 Vitamin D deficiency, unspecified: Secondary | ICD-10-CM

## 2018-05-31 ENCOUNTER — Other Ambulatory Visit: Payer: Self-pay | Admitting: Gynecology

## 2018-05-31 ENCOUNTER — Encounter: Payer: Self-pay | Admitting: Gynecology

## 2018-05-31 LAB — VITAMIN D 25 HYDROXY (VIT D DEFICIENCY, FRACTURES): VIT D 25 HYDROXY: 33 ng/mL (ref 30–100)

## 2018-05-31 MED ORDER — VITAMIN D (ERGOCALCIFEROL) 1.25 MG (50000 UNIT) PO CAPS: 50000.0000 [IU] | ORAL_CAPSULE | ORAL | 0 refills | Status: DC

## 2018-05-31 NOTE — Telephone Encounter (Signed)
Total dietary intake of calcium should be 1200 through 1500 mg daily.  She can check her diet to see an estimate of how much she is taking in and then supplement with extra calcium if needed.  She can google calcium content of food and it will tell her various foods and portions and calcium percentages.

## 2018-08-07 ENCOUNTER — Other Ambulatory Visit: Payer: Self-pay | Admitting: Gynecology

## 2018-08-07 DIAGNOSIS — Z1231 Encounter for screening mammogram for malignant neoplasm of breast: Secondary | ICD-10-CM

## 2018-08-16 ENCOUNTER — Other Ambulatory Visit: Payer: Self-pay | Admitting: Gynecology

## 2018-09-08 ENCOUNTER — Ambulatory Visit
Admission: RE | Admit: 2018-09-08 | Discharge: 2018-09-08 | Disposition: A | Discharge status: Home / Self Care | Payer: Managed Care, Other (non HMO) | Source: Ambulatory Visit | Attending: Gynecology | Admitting: Gynecology

## 2018-09-08 DIAGNOSIS — Z1231 Encounter for screening mammogram for malignant neoplasm of breast: Secondary | ICD-10-CM

## 2018-09-18 ENCOUNTER — Encounter: Payer: Self-pay | Admitting: Gynecology

## 2018-09-18 ENCOUNTER — Ambulatory Visit (INDEPENDENT_AMBULATORY_CARE_PROVIDER_SITE_OTHER): Payer: Managed Care, Other (non HMO) | Admitting: Gynecology

## 2018-09-18 VITALS — BP 124/80

## 2018-09-18 DIAGNOSIS — N926 Irregular menstruation, unspecified: Secondary | ICD-10-CM

## 2018-09-18 NOTE — Patient Instructions (Signed)
Call if irregular bleeding continues. 

## 2018-09-18 NOTE — Progress Notes (Signed)
    Joyce Burns Mar 15, 1961 [id number]        57 y.o.  [id number] presents with last menstrual period February 2019.  Has been having more irregular menses over the past year or so.  Had no bleeding until October when she bled on and off for approximately 2 weeks.  Several days of heavy menses like flow but now down to light spotting.  Also having some hot flushes and sweats.  Past medical history,surgical history, problem list, medications, allergies, family history and social history were all reviewed and documented in the EPIC chart.  Directed ROS with pertinent positives and negatives documented in the history of present illness/assessment and plan.  Exam: Kennon Portela assistant Vitals:   09/18/18 1410  BP: 124/80   General appearance:  Normal Abdomen soft nontender without masses guarding rebound Pelvic external BUS vagina normal.  Cervix normal.  Uterus normal size midline mobile nontender.  Adnexa without masses or tenderness.  Assessment/Plan:  57 y.o. [id number] with perimenopausal irregular bleeding.  Discussed differential to include most likely representing perimenopausal irregular bleeding.  Alternatives to include endometrial abnormality such as polyps, hyperplasia, submucous myomas, uterine cancer.  Follow-up to include observation for now versus pursuing work-up to include ultrasound possible endometrial sampling discussed.  At this point the patient is comfortable with expectant management.  She will report if she has any further irregular bleeding.  She is due to see me in January for her annual exam we will follow-up at that time but she will call sooner if she has recurrence of her bleeding.    Dara Lords MD, 2:31 PM 09/18/2018

## 2018-11-29 ENCOUNTER — Ambulatory Visit (INDEPENDENT_AMBULATORY_CARE_PROVIDER_SITE_OTHER): Payer: Managed Care, Other (non HMO) | Admitting: Gynecology

## 2018-11-29 ENCOUNTER — Encounter: Payer: Self-pay | Admitting: Gynecology

## 2018-11-29 VITALS — BP 118/76 | Ht 64.5 in | Wt 172.0 lb

## 2018-11-29 DIAGNOSIS — N952 Postmenopausal atrophic vaginitis: Secondary | ICD-10-CM | POA: Diagnosis not present

## 2018-11-29 DIAGNOSIS — E559 Vitamin D deficiency, unspecified: Secondary | ICD-10-CM

## 2018-11-29 DIAGNOSIS — Z13220 Encounter for screening for lipoid disorders: Secondary | ICD-10-CM | POA: Diagnosis not present

## 2018-11-29 DIAGNOSIS — Z01419 Encounter for gynecological examination (general) (routine) without abnormal findings: Secondary | ICD-10-CM | POA: Diagnosis not present

## 2018-11-29 NOTE — Addendum Note (Signed)
Addended by: Decklin Weddington K on: 09/20/2019 03:22 PM   Modules accepted: Orders  

## 2018-11-29 NOTE — Progress Notes (Signed)
    Joyce Burns 12-12-1960 [id number]        58 y.o.  [id number] for annual gynecologic exam.  Without gynecologic complaints.  Past medical history,surgical history, problem list, medications, allergies, family history and social history were all reviewed and documented as reviewed in the EPIC chart.  ROS:  Performed with pertinent positives and negatives included in the history, assessment and plan.   Additional significant findings : None   Exam: Kennon Portela assistant Vitals:   11/29/18 1406  BP: 118/76  Weight: 172 lb (78 kg)  Height: 5' 4.5" (1.638 m)   Body mass index is 29.07 kg/m.  General appearance:  Normal affect, orientation and appearance. Skin: Grossly normal HEENT: Without gross lesions.  No cervical or supraclavicular adenopathy. Thyroid normal.  Lungs:  Clear without wheezing, rales or rhonchi Cardiac: RR, without RMG Abdominal:  Soft, nontender, without masses, guarding, rebound, organomegaly or hernia Breasts:  Examined lying and sitting without masses, retractions, discharge or axillary adenopathy. Pelvic:  Ext, BUS, Vagina: Normal  Cervix: Normal.  Pap smear done  Uterus: Anteverted, normal size, shape and contour, midline and mobile nontender   Adnexa: Without masses or tenderness    Anus and perineum: Normal   Rectovaginal: Normal sphincter tone without palpated masses or tenderness.    Assessment/Plan:  58 y.o. [id number] female for annual gynecologic exam.   1. Perimenopausal.  Patient's menses have spaced out over the past 1 to 2 years.  Had bleeding earlier this year and then again in October.  No bleeding since.  Some hot flushes and sweats but otherwise doing well.  We will continue to monitor and report any prolonged or atypical bleeding or goes more than 1 year without bleeding and then has bleeding. 2. Colonoscopy 2017.  Repeat at their recommended interval. 3. Mammography 08/2018.  Continue with annual mammography when due.  Breast exam normal  today. 4. Pap smear/HPV 2015.  Pap smear done today.  No history of abnormal Pap smears previously.  Plan repeat Pap smear at 3-year intervals per current screening guidelines. 5. DEXA never.  Will plan further into the menopause. 6. Health maintenance.  Future orders placed for CBC, CMP, lipid profile and vitamin D.  Thyroid being monitored by Dr. Talmage Nap.  Follow-up in 1 year, sooner as needed.   Dara Lords MD, 2:50 PM 11/29/2018

## 2018-11-29 NOTE — Patient Instructions (Signed)
Follow-up for fasting blood work.  Follow-up in 1 year for annual exam 

## 2018-11-30 LAB — PAP IG W/ RFLX HPV ASCU

## 2018-12-01 ENCOUNTER — Other Ambulatory Visit: Payer: Managed Care, Other (non HMO)

## 2018-12-01 ENCOUNTER — Ambulatory Visit (INDEPENDENT_AMBULATORY_CARE_PROVIDER_SITE_OTHER): Payer: Managed Care, Other (non HMO) | Admitting: Gynecology

## 2018-12-01 DIAGNOSIS — Z01419 Encounter for gynecological examination (general) (routine) without abnormal findings: Secondary | ICD-10-CM

## 2018-12-01 DIAGNOSIS — Z23 Encounter for immunization: Secondary | ICD-10-CM

## 2018-12-01 DIAGNOSIS — E559 Vitamin D deficiency, unspecified: Secondary | ICD-10-CM

## 2018-12-01 DIAGNOSIS — Z13220 Encounter for screening for lipoid disorders: Secondary | ICD-10-CM

## 2018-12-02 LAB — COMPREHENSIVE METABOLIC PANEL
AG RATIO: 1.3 (calc) (ref 1.0–2.5)
ALKALINE PHOSPHATASE (APISO): 89 U/L (ref 33–130)
ALT: 15 U/L (ref 6–29)
AST: 17 U/L (ref 10–35)
Albumin: 4.4 g/dL (ref 3.6–5.1)
BILIRUBIN TOTAL: 0.6 mg/dL (ref 0.2–1.2)
BUN: 16 mg/dL (ref 7–25)
CALCIUM: 9.9 mg/dL (ref 8.6–10.4)
CO2: 28 mmol/L (ref 20–32)
Chloride: 102 mmol/L (ref 98–110)
Creat: 0.9 mg/dL (ref 0.50–1.05)
Globulin: 3.4 g/dL (calc) (ref 1.9–3.7)
Glucose, Bld: 93 mg/dL (ref 65–99)
Potassium: 4.5 mmol/L (ref 3.5–5.3)
Sodium: 139 mmol/L (ref 135–146)
TOTAL PROTEIN: 7.8 g/dL (ref 6.1–8.1)

## 2018-12-02 LAB — CBC WITH DIFFERENTIAL/PLATELET
Absolute Monocytes: 425 cells/uL (ref 200–950)
BASOS ABS: 18 {cells}/uL (ref 0–200)
Basophils Relative: 0.3 %
EOS PCT: 2.9 %
Eosinophils Absolute: 171 cells/uL (ref 15–500)
HEMATOCRIT: 39.1 % (ref 35.0–45.0)
HEMOGLOBIN: 13.1 g/dL (ref 11.7–15.5)
LYMPHS ABS: 2059 {cells}/uL (ref 850–3900)
MCH: 29.7 pg (ref 27.0–33.0)
MCHC: 33.5 g/dL (ref 32.0–36.0)
MCV: 88.7 fL (ref 80.0–100.0)
MONOS PCT: 7.2 %
MPV: 12.4 fL (ref 7.5–12.5)
NEUTROS ABS: 3227 {cells}/uL (ref 1500–7800)
Neutrophils Relative %: 54.7 %
Platelets: 248 10*3/uL (ref 140–400)
RBC: 4.41 10*6/uL (ref 3.80–5.10)
RDW: 12.5 % (ref 11.0–15.0)
Total Lymphocyte: 34.9 %
WBC: 5.9 10*3/uL (ref 3.8–10.8)

## 2018-12-02 LAB — VITAMIN D 25 HYDROXY (VIT D DEFICIENCY, FRACTURES): Vit D, 25-Hydroxy: 28 ng/mL — ABNORMAL LOW (ref 30–100)

## 2018-12-02 LAB — LIPID PANEL
CHOL/HDL RATIO: 3.6 (calc) (ref <5.0)
CHOLESTEROL: 213 mg/dL — AB (ref <200)
HDL: 59 mg/dL (ref >50)
LDL CHOLESTEROL (CALC): 130 mg/dL — AB
Non-HDL Cholesterol (Calc): 154 mg/dL (calc) — ABNORMAL HIGH (ref <130)
TRIGLYCERIDES: 129 mg/dL (ref <150)

## 2018-12-04 ENCOUNTER — Encounter: Payer: Self-pay | Admitting: Gynecology

## 2018-12-04 ENCOUNTER — Other Ambulatory Visit: Payer: Self-pay | Admitting: Gynecology

## 2018-12-04 DIAGNOSIS — E559 Vitamin D deficiency, unspecified: Secondary | ICD-10-CM

## 2018-12-04 MED ORDER — VITAMIN D (ERGOCALCIFEROL) 1.25 MG (50000 UNIT) PO CAPS
50000.0000 [IU] | ORAL_CAPSULE | ORAL | 0 refills | Status: DC
Start: 2018-12-04 — End: 2019-07-03

## 2018-12-04 NOTE — Telephone Encounter (Signed)
We will go ahead and give a prescription dose vitamin D 50,000 units weekly x12 weeks then recheck vitamin D level

## 2019-02-26 ENCOUNTER — Other Ambulatory Visit: Payer: Self-pay | Admitting: Gynecology

## 2019-02-26 DIAGNOSIS — E559 Vitamin D deficiency, unspecified: Secondary | ICD-10-CM

## 2019-05-02 ENCOUNTER — Ambulatory Visit: Payer: Managed Care, Other (non HMO) | Admitting: Physician Assistant

## 2019-07-03 ENCOUNTER — Ambulatory Visit (INDEPENDENT_AMBULATORY_CARE_PROVIDER_SITE_OTHER): Payer: Managed Care, Other (non HMO) | Admitting: Physician Assistant

## 2019-07-03 ENCOUNTER — Encounter: Payer: Self-pay | Admitting: Physician Assistant

## 2019-07-03 DIAGNOSIS — E559 Vitamin D deficiency, unspecified: Secondary | ICD-10-CM

## 2019-07-03 DIAGNOSIS — E039 Hypothyroidism, unspecified: Secondary | ICD-10-CM

## 2019-07-03 DIAGNOSIS — I10 Essential (primary) hypertension: Secondary | ICD-10-CM | POA: Diagnosis not present

## 2019-07-03 DIAGNOSIS — N951 Menopausal and female climacteric states: Secondary | ICD-10-CM | POA: Insufficient documentation

## 2019-07-03 NOTE — Assessment & Plan Note (Signed)
Taking supplementation. Will get records for review.

## 2019-07-03 NOTE — Assessment & Plan Note (Signed)
BP normotensive today. Asymptomatic. Notes recent labs with Endo. Will get records. Continue current regimen. Dietary and exercise recommendations reviewed.

## 2019-07-03 NOTE — Assessment & Plan Note (Signed)
Previously followed by Endo. She would like [place] to take over which I am comfortable with. Will check records from Endo to see when she is due for repeat TSH. Will call and schedule her for labs. Continue current regimen for now.

## 2019-07-03 NOTE — Progress Notes (Signed)
Virtual Visit via Video   I connected with patient on 07/03/19 at  9:00 AM EDT by a video enabled telemedicine application and verified that I am speaking with the correct person using two identifiers.  Location patient: Home Location provider: [facility], Office Persons participating in the virtual visit: Patient, Provider, [place] (Joyce Burns)  I discussed the limitations of evaluation and management by telemedicine and the availability of in person appointments. The patient expressed understanding and agreed to proceed.  Subjective:   HPI:   Patient presents via [facility] today to establish care. She is transferring from [facility]. Records have been requested. Denies acute concerns.   Hypertension -- Patient is currently on a regimen of losartan 25 mg QD. Endorses taking as directed. Notes tolerating well. Denies history of needing stress testing or echocardiogram. Denies family history of early CAD. Patient denies chest pain, palpitations, lightheadedness, dizziness, vision changes or frequent headaches. Is trying to keep active. Has family history of DM so has been working hard on TLC. Has started [employer] online and lost 6 lbs. Exercising daily.   Hypothyroidism -- Diagnosed in 2006. With Thyromegaly.  Had thyroid US and biopsy. Benign. Patient currently on regimen of levothyroxine 88 mcg QD. Endorses taking as directed. Is followed by Dr. Chalmers Cater (Endocrinology) but is wondering if she still needs to see a specialist. Labs twice yearly for TSH levels with her.   History of Vitamin D Deficiency -- Currently on a regimen of OTC D3 1000 I/U daily. Is taking as directed.   ROS:  Review of Systems  Constitutional: Negative for fever and weight loss.  HENT: Negative for ear discharge, ear pain, hearing loss and tinnitus.   Eyes: Negative for blurred vision, double vision, photophobia and pain.  Respiratory: Negative for cough and shortness of breath.   Cardiovascular: Negative  for chest pain and palpitations.  Gastrointestinal: Negative for abdominal pain, blood in stool, constipation, diarrhea, heartburn, melena, nausea and vomiting.  Genitourinary: Negative for dysuria, flank pain, frequency, hematuria and urgency.  Musculoskeletal: Negative for falls.  Neurological: Negative for dizziness, loss of consciousness and headaches.  Endo/Heme/Allergies: Negative for environmental allergies.  Psychiatric/Behavioral: Negative for depression, hallucinations, substance abuse and suicidal ideas. The patient is not nervous/anxious and does not have insomnia.    There are no active problems to display for this patient.   Social History   Tobacco Use  . Smoking status: Never Smoker  . Smokeless tobacco: Never Used  Substance Use Topics  . Alcohol use: No    Alcohol/week: 0.0 standard drinks    Current Outpatient Medications:  .  Cholecalciferol (VITAMIN D PO), Take 1 tablet by mouth daily. , Disp: , Rfl:  .  levothyroxine (SYNTHROID, LEVOTHROID) 88 MCG tablet, Take 88 mcg by mouth daily before breakfast. , Disp: , Rfl:  .  losartan (COZAAR) 25 MG tablet, Take 25 mg by mouth daily., Disp: , Rfl:  .  Multiple Vitamin (MULTIVITAMIN) capsule, Take 1 capsule by mouth daily.  , Disp: , Rfl:   Allergies  Allergen Reactions  . Biaxin [Clarithromycin] Nausea And Vomiting    Objective:   There were no vitals taken for this visit.  Patient is well-developed, well-nourished in no acute distress.  Resting comfortably at home.  Head is normocephalic, atraumatic.  No labored breathing.  Speech is clear and coherent with logical content.  Patient is alert and oriented at baseline.   Assessment and Plan:   Hypertension BP normotensive today. Asymptomatic. Notes recent labs with  Endo. Will get records. Continue current regimen. Dietary and exercise recommendations reviewed.   Hypothyroidism (acquired) Previously followed by Endo. She would like us to take over which I  am comfortable with. Will check records from Endo to see when she is due for repeat TSH. Will call and schedule her for labs. Continue current regimen for now.   Vitamin D deficiency disease Taking supplementation. Will get records for review.      Joyce ClimesWilliam Cody Nashton Belson, PA-C 07/03/2019

## 2019-07-25 ENCOUNTER — Telehealth: Payer: Self-pay

## 2019-07-25 NOTE — Telephone Encounter (Signed)
Have not received any records yet despite request. Please resend (ROI in chart). Would recommend because I have not received notes that we proceed with updating labs. Would like her scheduled for a fasting lab appt -- check TSH, Lipid Profile, CMP, A1C and CBC. Can do flu shot at that time. For Shingles vaccine I definitely recommend this -- it is indicated for anyone 50 and up who have had chicken pox before. Some insurance plans will not pay until age 56 or will only pay at certain pharmacies. Recommend she check with her insurance rep regarding this.

## 2019-07-25 NOTE — Telephone Encounter (Signed)
Patient called in wanting to know if PCP has received her records from her previous provider. Also wanted to know if she needed to have blood work done since [facility] is taking over all of her diagnoses instead of her continuing her care with Dr. Debbora Presto.   Patient is also requesting flu shot and to start shingle's series after her flu shot.   Advised patient I would route message to PCP and return her phone call.

## 2019-07-26 NOTE — Telephone Encounter (Signed)
Spoke with patient, we have never received her ROI, I scanned and emailed patient another copy. Aware that we need two forms, one for [facility] and one for Dr. Almetta Lovely office.

## 2019-08-02 ENCOUNTER — Other Ambulatory Visit: Payer: Self-pay

## 2019-08-02 ENCOUNTER — Ambulatory Visit (INDEPENDENT_AMBULATORY_CARE_PROVIDER_SITE_OTHER): Payer: Managed Care, Other (non HMO)

## 2019-08-02 DIAGNOSIS — Z23 Encounter for immunization: Secondary | ICD-10-CM

## 2019-08-07 ENCOUNTER — Telehealth: Payer: Self-pay | Admitting: Physician Assistant

## 2019-08-07 DIAGNOSIS — E039 Hypothyroidism, unspecified: Secondary | ICD-10-CM

## 2019-08-07 NOTE — Telephone Encounter (Signed)
Received records from Endo who she no longer sees as we are taking over management for hypothyroidism. She is overdue for TSH level. Let's call to get her scheduled for a lab appointment to check TSH. Order has been placed.

## 2019-08-09 NOTE — Telephone Encounter (Signed)
LMOVM advising patient that she is due for a TSH level. Please call the office to schedule a lab appointment. TSH level ordered already.

## 2019-08-10 ENCOUNTER — Encounter: Payer: Self-pay | Admitting: Emergency Medicine

## 2019-08-10 NOTE — Telephone Encounter (Signed)
My chart message sent to patient schedule a lab appointment to check thyroid level

## 2019-08-14 ENCOUNTER — Encounter: Payer: Self-pay | Admitting: Gynecology

## 2019-08-20 ENCOUNTER — Telehealth: Payer: Self-pay | Admitting: Physician Assistant

## 2019-08-20 NOTE — Telephone Encounter (Signed)
Pt called in asking while she is getting bw on Friday for her thyroid she wanted to know if Einar Pheasant would allow her to get her Vit D levels checked. Pt can be reached at the home #

## 2019-08-21 ENCOUNTER — Other Ambulatory Visit: Payer: Self-pay | Admitting: Emergency Medicine

## 2019-08-21 DIAGNOSIS — E559 Vitamin D deficiency, unspecified: Secondary | ICD-10-CM

## 2019-08-21 NOTE — Telephone Encounter (Signed)
Advised patient that Vitamin d level is added for upcoming lab appointment.

## 2019-08-24 ENCOUNTER — Ambulatory Visit: Payer: Managed Care, Other (non HMO)

## 2019-08-27 ENCOUNTER — Ambulatory Visit (INDEPENDENT_AMBULATORY_CARE_PROVIDER_SITE_OTHER): Payer: Managed Care, Other (non HMO)

## 2019-08-27 ENCOUNTER — Other Ambulatory Visit: Payer: Self-pay

## 2019-08-27 DIAGNOSIS — E559 Vitamin D deficiency, unspecified: Secondary | ICD-10-CM | POA: Diagnosis not present

## 2019-08-27 DIAGNOSIS — E039 Hypothyroidism, unspecified: Secondary | ICD-10-CM | POA: Diagnosis not present

## 2019-08-29 LAB — TSH: TSH: 0.3 u[IU]/mL — ABNORMAL LOW (ref 0.35–4.50)

## 2019-08-29 LAB — VITAMIN D 25 HYDROXY (VIT D DEFICIENCY, FRACTURES): VITD: 30.08 ng/mL (ref 30.00–100.00)

## 2019-08-30 ENCOUNTER — Other Ambulatory Visit (INDEPENDENT_AMBULATORY_CARE_PROVIDER_SITE_OTHER): Payer: Managed Care, Other (non HMO)

## 2019-08-30 DIAGNOSIS — E039 Hypothyroidism, unspecified: Secondary | ICD-10-CM | POA: Diagnosis not present

## 2019-08-30 LAB — T3, FREE: T3, Free: 3.2 pg/mL (ref 2.3–4.2)

## 2019-08-30 LAB — T4, FREE: Free T4: 1.28 ng/dL (ref 0.60–1.60)

## 2019-08-31 ENCOUNTER — Telehealth: Payer: Self-pay | Admitting: *Deleted

## 2019-08-31 ENCOUNTER — Other Ambulatory Visit: Payer: Self-pay | Admitting: Physician Assistant

## 2019-08-31 DIAGNOSIS — E039 Hypothyroidism, unspecified: Secondary | ICD-10-CM

## 2019-08-31 NOTE — Telephone Encounter (Signed)
Patient called in to discuss her results. Even though the T3 and T4 is normal she is still concerned because she feels like she can feel the effects from the TSH being slightly low.  She said normally when she starts to feel this way her medication has to be tweaked some to get it back in the normal range. Patient is aware that PCP is out of the office today but that I am sending a message to him and we will let her know what he says.

## 2019-09-03 ENCOUNTER — Other Ambulatory Visit: Payer: Self-pay | Admitting: Gynecology

## 2019-09-03 ENCOUNTER — Other Ambulatory Visit: Payer: Self-pay | Admitting: Physician Assistant

## 2019-09-03 DIAGNOSIS — Z1231 Encounter for screening mammogram for malignant neoplasm of breast: Secondary | ICD-10-CM

## 2019-09-10 NOTE — Telephone Encounter (Signed)
Pt calling to check on this, looks like it wasn't routed to the pcp to advise

## 2019-09-10 NOTE — Addendum Note (Signed)
Addended by: Leonidas Romberg on: 09/10/2019 04:52 PM   Modules accepted: Orders

## 2019-09-10 NOTE — Telephone Encounter (Signed)
If her thyroid were overactive she would be more energetic - not fatigued typically. Again recommend we recheck TSH with Free t4/t3 ( can do in 4 weeks) if moving further would adjust dose at that time. If normal again would leave at current dosing.

## 2019-09-10 NOTE — Telephone Encounter (Signed)
Sorry as I did not get this message until just now. It was just slightly low indicating potential for overactive thyroid and need to decrease medication -- it was so borderline that I wanted to check and would not tweak dosage at present unless she is noting increased anxiety, heat intolerance, jitteriness. Please let me know what she is currently experiencing.

## 2019-09-10 NOTE — Telephone Encounter (Signed)
Advised patient of PCP recommendations. She states she is not having anxious, nervousness, or jittery. Slight headache-improves with Tylenol, more fatigue. She can tell based on her symptoms if levels are changing.

## 2019-09-10 NOTE — Telephone Encounter (Signed)
Advised patient of PCP recommendations. She is agreeable. Lab appointment scheduled for 1 month. Lab orders placed.

## 2019-10-09 ENCOUNTER — Ambulatory Visit: Payer: Managed Care, Other (non HMO)

## 2019-10-23 ENCOUNTER — Other Ambulatory Visit: Payer: Self-pay | Admitting: Gynecology

## 2019-10-23 ENCOUNTER — Ambulatory Visit
Admission: RE | Admit: 2019-10-23 | Discharge: 2019-10-23 | Disposition: A | Discharge status: Home / Self Care | Payer: Managed Care, Other (non HMO) | Source: Ambulatory Visit | Attending: Physician Assistant | Admitting: Physician Assistant

## 2019-10-23 ENCOUNTER — Other Ambulatory Visit: Payer: Self-pay

## 2019-10-23 DIAGNOSIS — Z1231 Encounter for screening mammogram for malignant neoplasm of breast: Secondary | ICD-10-CM

## 2019-10-26 ENCOUNTER — Telehealth: Payer: Self-pay | Admitting: *Deleted

## 2019-10-26 NOTE — Telephone Encounter (Signed)
I have left a message for patient letting her know the information below.    ----- Message -----  From: Philbert Riser  Sent: 10/25/2019  1:13 PM EST  To: Glenford Bayley,   Not sure why they told her it was coded incorrectly. He went over her conditions (HTN, Thyroid, etc...)ordered necessary labs after requesting records. It did go to her deductible. Coding and documentation look good.  Thanks,  Dawn   ----- Message -----  From: Francine Graven  Sent: 10/25/2019 12:48 PM EST  To: Clent Demark afternoon Dawn,   This patient called because she could not get and answer from billing. Can you review? It looks like it went to her deductible. She had a NP appointment with Elyn Aquas on 07/03/2019 (virtual), and then labs 10/12-10/15. She states that she received almost $400 bill and she has contacted the [facility] billing twice already and was told it was coded incorrectly, but she states that she just keeps getting bills and it doesn't seem to be changing. She has [employer] and states that she has never had to pay this much for visits and she wants to know if someone else can look at this or tell her who to contact since she does not seem to be making any progress with billing. Just let me know what you think.  Phone Number [phone]     Thanks,   Modena Nunnery

## 2020-05-12 ENCOUNTER — Ambulatory Visit (INDEPENDENT_AMBULATORY_CARE_PROVIDER_SITE_OTHER): Payer: Managed Care, Other (non HMO) | Admitting: Nurse Practitioner

## 2020-05-12 ENCOUNTER — Other Ambulatory Visit: Payer: Self-pay

## 2020-05-12 ENCOUNTER — Encounter: Payer: Self-pay | Admitting: Nurse Practitioner

## 2020-05-12 VITALS — BP 118/76 | Ht 65.5 in | Wt 171.0 lb

## 2020-05-12 DIAGNOSIS — Z01419 Encounter for gynecological examination (general) (routine) without abnormal findings: Secondary | ICD-10-CM | POA: Diagnosis not present

## 2020-05-12 DIAGNOSIS — Z13220 Encounter for screening for lipoid disorders: Secondary | ICD-10-CM

## 2020-05-12 DIAGNOSIS — R350 Frequency of micturition: Secondary | ICD-10-CM

## 2020-05-12 NOTE — Patient Instructions (Signed)
Health Maintenance, Female Adopting a healthy lifestyle and getting preventive care are important in promoting health and wellness. Ask your health care provider about:  The right schedule for you to have regular tests and exams.  Things you can do on your own to prevent diseases and keep yourself healthy. What should I know about diet, weight, and exercise? Eat a healthy diet   Eat a diet that includes plenty of vegetables, fruits, low-fat dairy products, and lean protein.  Do not eat a lot of foods that are high in solid fats, added sugars, or sodium. Maintain a healthy weight Body mass index (BMI) is used to identify weight problems. It estimates body fat based on height and weight. Your health care provider can help determine your BMI and help you achieve or maintain a healthy weight. Get regular exercise Get regular exercise. This is one of the most important things you can do for your health. Most adults should:  Exercise for at least 150 minutes each week. The exercise should increase your heart rate and make you sweat (moderate-intensity exercise).  Do strengthening exercises at least twice a week. This is in addition to the moderate-intensity exercise.  Spend less time sitting. Even light physical activity can be beneficial. Watch cholesterol and blood lipids Have your blood tested for lipids and cholesterol at 57 years of age, then have this test every 5 years. Have your cholesterol levels checked more often if:  Your lipid or cholesterol levels are high.  You are older than 57 years of age.  You are at high risk for heart disease. What should I know about cancer screening? Depending on your health history and family history, you may need to have cancer screening at various ages. This may include screening for:  Breast cancer.  Cervical cancer.  Colorectal cancer.  Skin cancer.  Lung cancer. What should I know about heart disease, diabetes, and high blood  pressure? Blood pressure and heart disease  High blood pressure causes heart disease and increases the risk of stroke. This is more likely to develop in people who have high blood pressure readings, are of African descent, or are overweight.  Have your blood pressure checked: ? Every 3-5 years if you are 18-39 years of age. ? Every year if you are 40 years old or older. Diabetes Have regular diabetes screenings. This checks your fasting blood sugar level. Have the screening done:  Once every three years after age 40 if you are at a normal weight and have a low risk for diabetes.  More often and at a younger age if you are overweight or have a high risk for diabetes. What should I know about preventing infection? Hepatitis B If you have a higher risk for hepatitis B, you should be screened for this virus. Talk with your health care provider to find out if you are at risk for hepatitis B infection. Hepatitis C Testing is recommended for:  Everyone born from 1945 through 1965.  Anyone with known risk factors for hepatitis C. Sexually transmitted infections (STIs)  Get screened for STIs, including gonorrhea and chlamydia, if: ? You are sexually active and are younger than 57 years of age. ? You are older than 57 years of age and your health care provider tells you that you are at risk for this type of infection. ? Your sexual activity has changed since you were last screened, and you are at increased risk for chlamydia or gonorrhea. Ask your health care provider if   you are at risk.  Ask your health care provider about whether you are at high risk for HIV. Your health care provider may recommend a prescription medicine to help prevent HIV infection. If you choose to take medicine to prevent HIV, you should first get tested for HIV. You should then be tested every 3 months for as long as you are taking the medicine. Pregnancy  If you are about to stop having your period (premenopausal) and  you may become pregnant, seek counseling before you get pregnant.  Take 400 to 800 micrograms (mcg) of folic acid every day if you become pregnant.  Ask for birth control (contraception) if you want to prevent pregnancy. Osteoporosis and menopause Osteoporosis is a disease in which the bones lose minerals and strength with aging. This can result in bone fractures. If you are 65 years old or older, or if you are at risk for osteoporosis and fractures, ask your health care provider if you should:  Be screened for bone loss.  Take a calcium or vitamin D supplement to lower your risk of fractures.  Be given hormone replacement therapy (HRT) to treat symptoms of menopause. Follow these instructions at home: Lifestyle  Do not use any products that contain nicotine or tobacco, such as cigarettes, e-cigarettes, and chewing tobacco. If you need help quitting, ask your health care provider.  Do not use street drugs.  Do not share needles.  Ask your health care provider for help if you need support or information about quitting drugs. Alcohol use  Do not drink alcohol if: ? Your health care provider tells you not to drink. ? You are pregnant, may be pregnant, or are planning to become pregnant.  If you drink alcohol: ? Limit how much you use to 0-1 drink a day. ? Limit intake if you are breastfeeding.  Be aware of how much alcohol is in your drink. In the [place] one drink equals one 12 oz bottle of beer (355 mL), one 5 oz glass of wine (148 mL), or one 1 oz glass of hard liquor (44 mL). General instructions  Schedule regular health, dental, and eye exams.  Stay current with your vaccines.  Tell your health care provider if: ? You often feel depressed. ? You have ever been abused or do not feel safe at home. Summary  Adopting a healthy lifestyle and getting preventive care are important in promoting health and wellness.  Follow your health care provider's instructions about healthy  diet, exercising, and getting tested or screened for diseases.  Follow your health care provider's instructions on monitoring your cholesterol and blood pressure. This information is not intended to replace advice given to you by your health care provider. Make sure you discuss any questions you have with your health care provider. Document Revised: 10/25/2018 Document Reviewed: 10/25/2018 Elsevier Patient Education  2020 [employer]  

## 2020-05-12 NOTE — Progress Notes (Signed)
   Joyce Burns 09-Sep-1961 [id number]   History:  59 y.o. G2 P2 presents for annual exam. Hypothyroidism managed by endocrinology. History of Vitamin D deficiency. Postmenopausal since 08/2018, no HRT, some night sweats but overall minimal symptoms. Has started working with a Systems analyst and is down 8 pounds. Interested in the Shingrix vaccine but knows we do not provide them here.   Contraception: post menopausal status Last Pap: 11/29/2018. Results were: normal Last mammogram: 10/23/2019. Results were: normal Last colonoscopy: 2017. Results were:    Past medical history, past surgical history, family history and social history were all reviewed and documented in the EPIC chart.  ROS:  A ROS was performed and pertinent positives and negatives are included.  Exam:  Vitals:   05/12/20 1453  BP: 118/76  Weight: 171 lb (77.6 kg)  Height: 5' 5.5" (1.664 m)   Body mass index is 28.02 kg/m.  General appearance:  Normal Thyroid:  Symmetrical, normal in size, without palpable masses or nodularity. Respiratory  Auscultation:  Clear without wheezing or rhonchi Cardiovascular  Auscultation:  Regular rate, without rubs, murmurs or gallops  Edema/varicosities:  Not grossly evident Abdominal  Soft,nontender, without masses, guarding or rebound.  Liver/spleen:  No organomegaly noted  Hernia:  None appreciated  Skin  Inspection:  Grossly normal   Breasts: Examined lying and sitting.   Right: Without masses, retractions, discharge or axillary adenopathy.   Left: Without masses, retractions, discharge or axillary adenopathy. Gentitourinary   Inguinal/mons:  Normal without inguinal adenopathy  External genitalia:  Normal  BUS/Urethra/Skene's glands:  Normal  Vagina:  Normal, mild atrophic changes  Cervix:  Normal  Uterus:  Anteverted, normal in size, shape and contour.  Midline and mobile  Adnexa/parametria:     Rt: Without masses or tenderness.   Lt: Without masses or  tenderness.  Anus and perineum: Normal  Digital rectal exam: Normal sphincter tone without palpated masses or tenderness  Assessment/Plan:  59 y.o. G2 P2 for annual exam without GYN complaints.   Well female exam with routine gynecological exam - Plan: CBC with Differential/Platelet, Comprehensive metabolic panel. Education provided on SBEs, importance of preventative screenings, current guidelines, high calcium diet, regular exercise, and multivitamin daily.   Lipid screening - Plan: Lipid panel  Frequent urination - Plan: Urinalysis,Complete w/RFL Culture  Follow up in 1 year for annual    Olivia Mackie [facility], 3:00 PM 05/12/2020

## 2020-05-13 ENCOUNTER — Other Ambulatory Visit: Payer: Self-pay | Admitting: Nurse Practitioner

## 2020-05-13 ENCOUNTER — Other Ambulatory Visit: Payer: Managed Care, Other (non HMO)

## 2020-05-13 DIAGNOSIS — Z13220 Encounter for screening for lipoid disorders: Secondary | ICD-10-CM

## 2020-05-13 DIAGNOSIS — Z01419 Encounter for gynecological examination (general) (routine) without abnormal findings: Secondary | ICD-10-CM

## 2020-05-13 DIAGNOSIS — N3000 Acute cystitis without hematuria: Secondary | ICD-10-CM

## 2020-05-13 LAB — CBC WITH DIFFERENTIAL/PLATELET
Absolute Monocytes: 512 cells/uL (ref 200–950)
Basophils Absolute: 28 cells/uL (ref 0–200)
Basophils Relative: 0.5 %
Eosinophils Absolute: 88 cells/uL (ref 15–500)
Eosinophils Relative: 1.6 %
HCT: 39.2 % (ref 35.0–45.0)
Hemoglobin: 13 g/dL (ref 11.7–15.5)
Lymphs Abs: 1892 cells/uL (ref 850–3900)
MCH: 29.5 pg (ref 27.0–33.0)
MCHC: 33.2 g/dL (ref 32.0–36.0)
MCV: 89.1 fL (ref 80.0–100.0)
MPV: 12.6 fL — ABNORMAL HIGH (ref 7.5–12.5)
Monocytes Relative: 9.3 %
Neutro Abs: 2981 cells/uL (ref 1500–7800)
Neutrophils Relative %: 54.2 %
Platelets: 229 10*3/uL (ref 140–400)
RBC: 4.4 10*6/uL (ref 3.80–5.10)
RDW: 13.3 % (ref 11.0–15.0)
Total Lymphocyte: 34.4 %
WBC: 5.5 10*3/uL (ref 3.8–10.8)

## 2020-05-13 LAB — LIPID PANEL
Cholesterol: 234 mg/dL — ABNORMAL HIGH (ref <200)
HDL: 58 mg/dL (ref >=50)
LDL Cholesterol (Calc): 151 mg/dL (calc) — ABNORMAL HIGH
Non-HDL Cholesterol (Calc): 176 mg/dL (calc) — ABNORMAL HIGH (ref <130)
Total CHOL/HDL Ratio: 4 (calc) (ref <5.0)
Triglycerides: 129 mg/dL (ref <150)

## 2020-05-13 LAB — COMPREHENSIVE METABOLIC PANEL
AG Ratio: 1.5 (calc) (ref 1.0–2.5)
ALT: 14 U/L (ref 6–29)
AST: 15 U/L (ref 10–35)
Albumin: 4.5 g/dL (ref 3.6–5.1)
Alkaline phosphatase (APISO): 90 U/L (ref 37–153)
BUN: 19 mg/dL (ref 7–25)
CO2: 28 mmol/L (ref 20–32)
Calcium: 9.7 mg/dL (ref 8.6–10.4)
Chloride: 103 mmol/L (ref 98–110)
Creat: 0.89 mg/dL (ref 0.50–1.05)
Globulin: 3.1 g/dL (calc) (ref 1.9–3.7)
Glucose, Bld: 97 mg/dL (ref 65–99)
Potassium: 4.2 mmol/L (ref 3.5–5.3)
Sodium: 138 mmol/L (ref 135–146)
Total Bilirubin: 0.6 mg/dL (ref 0.2–1.2)
Total Protein: 7.6 g/dL (ref 6.1–8.1)

## 2020-05-13 MED ORDER — SULFAMETHOXAZOLE-TRIMETHOPRIM 800-160 MG PO TABS: 1.0000 | ORAL_TABLET | Freq: Two times a day (BID) | ORAL | 0 refills | Status: AC

## 2020-05-13 NOTE — Progress Notes (Signed)
UA: trace leukocytes, few bacteria. Having symptoms of urinary frequency. Bactrim BID for 3 days. Culture pending.

## 2020-05-14 ENCOUNTER — Other Ambulatory Visit: Payer: Self-pay | Admitting: Nurse Practitioner

## 2020-05-14 DIAGNOSIS — E782 Mixed hyperlipidemia: Secondary | ICD-10-CM

## 2020-05-14 LAB — URINALYSIS, COMPLETE W/RFL CULTURE
Bilirubin Urine: NEGATIVE
Glucose, UA: NEGATIVE
Hyaline Cast: NONE SEEN /LPF
Ketones, ur: NEGATIVE
Nitrites, Initial: NEGATIVE
Protein, ur: NEGATIVE
RBC / HPF: NONE SEEN /HPF (ref 0–2)
Specific Gravity, Urine: 1.01 (ref 1.001–1.03)
pH: 5.5 (ref 5.0–8.0)

## 2020-05-14 LAB — CULTURE INDICATED

## 2020-05-14 LAB — URINE CULTURE
MICRO NUMBER:: 10641775
Result:: NO GROWTH
SPECIMEN QUALITY:: ADEQUATE

## 2020-05-27 ENCOUNTER — Ambulatory Visit (INDEPENDENT_AMBULATORY_CARE_PROVIDER_SITE_OTHER): Payer: Managed Care, Other (non HMO)

## 2020-05-27 ENCOUNTER — Other Ambulatory Visit: Payer: Self-pay

## 2020-05-27 DIAGNOSIS — Z23 Encounter for immunization: Secondary | ICD-10-CM

## 2020-05-27 NOTE — Progress Notes (Signed)
Joyce Burns, 59 year old female, presents to the office today for her 1st Shingrix vaccine. Vaccine administered in patient's right deltoid. Patient tolerated well and left the office in good condition. Patient is aware the 2nd dose needs to be administered within 2-4 months. Lana Fish, LPN

## 2020-05-27 NOTE — Progress Notes (Signed)
LPN note reviewed.  Michol Emory Cody Oshua Mcconaha, PA-C  

## 2020-06-30 ENCOUNTER — Other Ambulatory Visit: Payer: Self-pay | Admitting: Nurse Practitioner

## 2020-06-30 DIAGNOSIS — E559 Vitamin D deficiency, unspecified: Secondary | ICD-10-CM

## 2020-07-01 ENCOUNTER — Other Ambulatory Visit: Payer: Self-pay

## 2020-07-01 ENCOUNTER — Other Ambulatory Visit: Payer: Managed Care, Other (non HMO)

## 2020-07-01 DIAGNOSIS — E559 Vitamin D deficiency, unspecified: Secondary | ICD-10-CM

## 2020-07-02 LAB — VITAMIN D 25 HYDROXY (VIT D DEFICIENCY, FRACTURES): Vit D, 25-Hydroxy: 31 ng/mL (ref 30–100)

## 2020-09-08 ENCOUNTER — Other Ambulatory Visit: Payer: Self-pay | Admitting: Nurse Practitioner

## 2020-09-08 DIAGNOSIS — Z1231 Encounter for screening mammogram for malignant neoplasm of breast: Secondary | ICD-10-CM

## 2020-10-23 ENCOUNTER — Ambulatory Visit
Admission: RE | Admit: 2020-10-23 | Discharge: 2020-10-23 | Disposition: A | Discharge status: Home / Self Care | Payer: Managed Care, Other (non HMO) | Source: Ambulatory Visit | Attending: Nurse Practitioner | Admitting: Nurse Practitioner

## 2020-10-23 ENCOUNTER — Other Ambulatory Visit: Payer: Self-pay

## 2020-10-23 DIAGNOSIS — Z1231 Encounter for screening mammogram for malignant neoplasm of breast: Secondary | ICD-10-CM

## 2020-10-27 ENCOUNTER — Other Ambulatory Visit: Payer: Self-pay | Admitting: Nurse Practitioner

## 2020-10-27 DIAGNOSIS — R928 Other abnormal and inconclusive findings on diagnostic imaging of breast: Secondary | ICD-10-CM

## 2020-10-28 ENCOUNTER — Other Ambulatory Visit: Payer: Managed Care, Other (non HMO)

## 2020-10-28 ENCOUNTER — Other Ambulatory Visit: Payer: Self-pay

## 2020-10-28 DIAGNOSIS — E782 Mixed hyperlipidemia: Secondary | ICD-10-CM

## 2020-10-29 LAB — LIPID PANEL
Cholesterol: 216 mg/dL — ABNORMAL HIGH (ref <200)
HDL: 53 mg/dL (ref >=50)
LDL Cholesterol (Calc): 133 mg/dL (calc) — ABNORMAL HIGH
Non-HDL Cholesterol (Calc): 163 mg/dL (calc) — ABNORMAL HIGH (ref <130)
Total CHOL/HDL Ratio: 4.1 (calc) (ref <5.0)
Triglycerides: 163 mg/dL — ABNORMAL HIGH (ref <150)

## 2020-10-30 ENCOUNTER — Ambulatory Visit
Admission: RE | Admit: 2020-10-30 | Discharge: 2020-10-30 | Disposition: A | Discharge status: Home / Self Care | Payer: Managed Care, Other (non HMO) | Source: Ambulatory Visit | Attending: Nurse Practitioner | Admitting: Nurse Practitioner

## 2020-10-30 ENCOUNTER — Other Ambulatory Visit: Payer: Self-pay

## 2020-10-30 DIAGNOSIS — R928 Other abnormal and inconclusive findings on diagnostic imaging of breast: Secondary | ICD-10-CM

## 2020-11-04 ENCOUNTER — Other Ambulatory Visit: Payer: Managed Care, Other (non HMO)

## 2021-03-16 ENCOUNTER — Telehealth: Payer: Self-pay

## 2021-03-16 NOTE — Telephone Encounter (Signed)
LVM advising patient to call back and schedule TOC. 

## 2021-05-04 ENCOUNTER — Other Ambulatory Visit: Payer: Self-pay | Admitting: Nurse Practitioner

## 2021-05-04 DIAGNOSIS — N63 Unspecified lump in unspecified breast: Secondary | ICD-10-CM

## 2021-05-13 ENCOUNTER — Encounter: Payer: Managed Care, Other (non HMO) | Admitting: Nurse Practitioner

## 2021-05-21 ENCOUNTER — Encounter: Payer: Self-pay | Admitting: Nurse Practitioner

## 2021-05-21 ENCOUNTER — Other Ambulatory Visit (HOSPITAL_COMMUNITY)
Admission: RE | Admit: 2021-05-21 | Discharge: 2021-05-21 | Disposition: A | Discharge status: Home / Self Care | Payer: Managed Care, Other (non HMO) | Source: Ambulatory Visit | Attending: Nurse Practitioner | Admitting: Nurse Practitioner

## 2021-05-21 ENCOUNTER — Other Ambulatory Visit: Payer: Self-pay

## 2021-05-21 ENCOUNTER — Ambulatory Visit (INDEPENDENT_AMBULATORY_CARE_PROVIDER_SITE_OTHER): Payer: Managed Care, Other (non HMO) | Admitting: Nurse Practitioner

## 2021-05-21 VITALS — BP 122/78 | Ht 64.0 in | Wt 153.0 lb

## 2021-05-21 DIAGNOSIS — Z8639 Personal history of other endocrine, nutritional and metabolic disease: Secondary | ICD-10-CM | POA: Diagnosis not present

## 2021-05-21 DIAGNOSIS — N951 Menopausal and female climacteric states: Secondary | ICD-10-CM

## 2021-05-21 DIAGNOSIS — E785 Hyperlipidemia, unspecified: Secondary | ICD-10-CM

## 2021-05-21 DIAGNOSIS — Z01419 Encounter for gynecological examination (general) (routine) without abnormal findings: Secondary | ICD-10-CM

## 2021-05-21 DIAGNOSIS — Z780 Asymptomatic menopausal state: Secondary | ICD-10-CM | POA: Diagnosis not present

## 2021-05-21 DIAGNOSIS — R7303 Prediabetes: Secondary | ICD-10-CM

## 2021-05-21 NOTE — Progress Notes (Signed)
   Joyce Burns February 09, 1961 [id number]   History:  60 y.o. G2 P2 presents for annual exam. Postmenopausal - no HRT, no bleeding. Complains of pain with intercourse due to dryness. Currently using a water-based lubricant. Normal pap and mammogram history. Hypothyroidism managed by endocrinology. History of Vitamin D deficiency. Lost 28 pounds with Optavia, has gained about 6 pounds back.   Gynecologic History Contraception: post menopausal status  Health Maintenance Last Pap: 11/29/2018. Results were: normal Last mammogram (diagnostic): 10/30/2020. Results were: Left benign cyst, 6 month follow up scheduled 8/1 Last colonoscopy: 2017. Results were: Normal Last Dexa: Not indicated  Past medical history, past surgical history, family history and social history were all reviewed and documented in the EPIC chart. Married. Preschool teacher.   ROS:  A ROS was performed and pertinent positives and negatives are included.  Exam:  Vitals:   05/21/21 1122  BP: 122/78  Weight: 153 lb (69.4 kg)  Height: 5\' 4"  (1.626 m)   Body mass index is 26.26 kg/m.  General appearance:  Normal Thyroid:  Symmetrical, normal in size, without palpable masses or nodularity. Respiratory  Auscultation:  Clear without wheezing or rhonchi Cardiovascular  Auscultation:  Regular rate, without rubs, murmurs or gallops  Edema/varicosities:  Not grossly evident Abdominal  Soft,nontender, without masses, guarding or rebound.  Liver/spleen:  No organomegaly noted  Hernia:  None appreciated  Skin  Inspection:  Grossly normal   Breasts: Examined lying and sitting.   Right: Without masses, retractions, discharge or axillary adenopathy.   Left: Without masses, retractions, discharge or axillary adenopathy. Gentitourinary   Inguinal/mons:  Normal without inguinal adenopathy  External genitalia:  Normal  BUS/Urethra/Skene's glands:  Normal  Vagina:  Normal, mild atrophic changes  Cervix:  Normal  Uterus:   Anteverted, normal in size, shape and contour.  Midline and mobile  Adnexa/parametria:     Rt: Without masses or tenderness.   Lt: Without masses or tenderness.  Anus and perineum: Normal  Digital rectal exam: Normal sphincter tone without palpated masses or tenderness  Assessment/Plan:  60 y.o. G2 P2 for annual exam without GYN complaints.   Well female exam with routine gynecological exam - Plan: CBC with Differential/Platelet, Comprehensive metabolic panel, Cytology - PAP( Branch). Education provided on SBEs, importance of preventative screenings, current guidelines, high calcium diet, regular exercise, and multivitamin daily. Congratulated on weight loss.   Postmenopausal - no HRT, no bleeding.   Hyperlipidemia, unspecified hyperlipidemia type - Plan: Lipid panel  History of vitamin D deficiency - Plan: VITAMIN D 25 Hydroxy (Vit-D Deficiency, Fractures)  Prediabetes - Plan: Hemoglobin A1c  Menopausal vaginal dryness - recommend switching to oil or silicone-based lubricant during intercourse. We also discussed the option for vaginal estrogen and she would like to try OTC lubricant first.   Screening for cervical cancer - Normal Pap history. Pap with HR HPV today.   Screening for breast cancer - Benign cyst in left breast being followed. 6 month ultrasound scheduled in August. Normal breast exam today.  Screening for colon cancer - 2017 colonoscopy. Will repeat at GI's recommended interval.   Screening for osteoporosis - Low risk. Will begin bone density testing at age 47.   Return in 1 year for annual.      76 [facility], 11:35 AM 05/21/2021

## 2021-05-22 ENCOUNTER — Encounter: Payer: Self-pay | Admitting: Nurse Practitioner

## 2021-05-22 DIAGNOSIS — R7303 Prediabetes: Secondary | ICD-10-CM

## 2021-05-22 DIAGNOSIS — E7800 Pure hypercholesterolemia, unspecified: Secondary | ICD-10-CM

## 2021-05-22 LAB — LIPID PANEL
Cholesterol: 269 mg/dL — ABNORMAL HIGH (ref <200)
HDL: 68 mg/dL (ref >=50)
LDL Cholesterol (Calc): 171 mg/dL (calc) — ABNORMAL HIGH
Non-HDL Cholesterol (Calc): 201 mg/dL (calc) — ABNORMAL HIGH (ref <130)
Total CHOL/HDL Ratio: 4 (calc) (ref <5.0)
Triglycerides: 157 mg/dL — ABNORMAL HIGH (ref <150)

## 2021-05-22 LAB — CBC WITH DIFFERENTIAL/PLATELET
Absolute Monocytes: 531 cells/uL (ref 200–950)
Basophils Absolute: 41 cells/uL (ref 0–200)
Basophils Relative: 0.6 %
Eosinophils Absolute: 152 cells/uL (ref 15–500)
Eosinophils Relative: 2.2 %
HCT: 44.2 % (ref 35.0–45.0)
Hemoglobin: 14.5 g/dL (ref 11.7–15.5)
Lymphs Abs: 2132 cells/uL (ref 850–3900)
MCH: 29.2 pg (ref 27.0–33.0)
MCHC: 32.8 g/dL (ref 32.0–36.0)
MCV: 89.1 fL (ref 80.0–100.0)
MPV: 12.1 fL (ref 7.5–12.5)
Monocytes Relative: 7.7 %
Neutro Abs: 4043 cells/uL (ref 1500–7800)
Neutrophils Relative %: 58.6 %
Platelets: 244 10*3/uL (ref 140–400)
RBC: 4.96 10*6/uL (ref 3.80–5.10)
RDW: 13 % (ref 11.0–15.0)
Total Lymphocyte: 30.9 %
WBC: 6.9 10*3/uL (ref 3.8–10.8)

## 2021-05-22 LAB — COMPREHENSIVE METABOLIC PANEL
AG Ratio: 1.4 (calc) (ref 1.0–2.5)
ALT: 17 U/L (ref 6–29)
AST: 20 U/L (ref 10–35)
Albumin: 4.9 g/dL (ref 3.6–5.1)
Alkaline phosphatase (APISO): 83 U/L (ref 37–153)
BUN/Creatinine Ratio: 21 (calc) (ref 6–22)
BUN: 24 mg/dL (ref 7–25)
CO2: 28 mmol/L (ref 20–32)
Calcium: 10.4 mg/dL (ref 8.6–10.4)
Chloride: 101 mmol/L (ref 98–110)
Creat: 1.15 mg/dL — ABNORMAL HIGH (ref 0.50–1.05)
Globulin: 3.4 g/dL (calc) (ref 1.9–3.7)
Glucose, Bld: 97 mg/dL (ref 65–99)
Potassium: 5.2 mmol/L (ref 3.5–5.3)
Sodium: 137 mmol/L (ref 135–146)
Total Bilirubin: 0.7 mg/dL (ref 0.2–1.2)
Total Protein: 8.3 g/dL — ABNORMAL HIGH (ref 6.1–8.1)

## 2021-05-22 LAB — HEMOGLOBIN A1C
Hgb A1c MFr Bld: 5.6 % of total Hgb (ref <5.7)
Mean Plasma Glucose: 114 mg/dL
eAG (mmol/L): 6.3 mmol/L

## 2021-05-22 LAB — VITAMIN D 25 HYDROXY (VIT D DEFICIENCY, FRACTURES): Vit D, 25-Hydroxy: 48 ng/mL (ref 30–100)

## 2021-05-25 LAB — CYTOLOGY - PAP
Comment: NEGATIVE
Diagnosis: NEGATIVE
High risk HPV: NEGATIVE

## 2021-06-03 ENCOUNTER — Other Ambulatory Visit: Payer: Self-pay

## 2021-06-03 ENCOUNTER — Ambulatory Visit (INDEPENDENT_AMBULATORY_CARE_PROVIDER_SITE_OTHER): Payer: Managed Care, Other (non HMO) | Admitting: Family Medicine

## 2021-06-03 ENCOUNTER — Encounter: Payer: Self-pay | Admitting: Family Medicine

## 2021-06-03 VITALS — BP 124/66 | HR 88 | Temp 98.1°F | Resp 16 | Ht 64.0 in | Wt 153.2 lb

## 2021-06-03 DIAGNOSIS — Z8249 Family history of ischemic heart disease and other diseases of the circulatory system: Secondary | ICD-10-CM | POA: Diagnosis not present

## 2021-06-03 DIAGNOSIS — E039 Hypothyroidism, unspecified: Secondary | ICD-10-CM | POA: Diagnosis not present

## 2021-06-03 DIAGNOSIS — E785 Hyperlipidemia, unspecified: Secondary | ICD-10-CM

## 2021-06-03 NOTE — Progress Notes (Signed)
Subjective:  Patient ID: Joyce Burns, female    DOB: [birth year]  Age: 60 y.o. MRN: [id number]  CC:  Chief Complaint  Patient presents with   Establish Care    Pt here to do TOC from [place] doing well no concerns recent labs done at [facility] a few weeks ago.     HPI Joyce Burns presents for   New patient to establish care.  Previous primary care provider Malva Cogan, PA-C. Seen recently by GYN, Wyline Beady, NP, prior Dr. Audie Box.  [facility].  Note reviewed, labs obtained at that time including cholesterol which was elevated, mild increase in creatinine from prior baseline of 0.89-1.15 and mild elevation in total protein of 8.3. A1c was normal at 5.6, down from 5.7 previously.  Pap negative with negative HPV.  Normal CBC.  Normal vitamin D. 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   Hypothyroidism: Lab Results  Component Value Date   TSH 0.30 (L) 08/27/2019  Taking medication daily.  Synthroid 75 mcg qd, followed by Dr. Talmage Nap, controlled TSH in January, appt August 1st.  No new hot or cold intolerance. No new hair or skin changes, heart palpitations or new fatigue. No new weight changes.   Hyperlipidemia: No current statin.  On Optavia diet - lost 28#'s, then 5 # weight gain. On maintenance.  Prior on losartan for HTN, off with weight loss on Optavia.  Some decreased exercise past 6 months, restarted walking last week - 4 times per week, 2 miles about .  Brother with diabetes.  Wt Readings from Last 3 Encounters:  06/03/21 153 lb 3.2 oz (69.5 kg)  05/21/21 153 lb (69.4 kg)  05/12/20 171 lb (77.6 kg)   Father passed at 74 yo with MI (had D, and obese).  The 10-year ASCVD risk score Denman George DC Montez Hageman., et al., 2013) is: 2.8%   Values used to calculate the score:     Age: 23 years     Sex: Female     Is Non-Hispanic African American: No     Diabetic: No     Tobacco smoker: No     Systolic Blood Pressure: 124 mmHg     Is BP treated: No     HDL  Cholesterol: 68 mg/dL     Total Cholesterol: 269 mg/dL  Lab Results  Component Value Date   CHOL 269 (H) 05/21/2021   HDL 68 05/21/2021   LDLCALC 171 (H) 05/21/2021   TRIG 157 (H) 05/21/2021   CHOLHDL 4.0 05/21/2021   Lab Results  Component Value Date   ALT 17 05/21/2021   AST 20 05/21/2021   ALKPHOS 70 07/04/2014   BILITOT 0.7 05/21/2021    HM: Planning on covid booster #2.  Plans on 2nd shingrix next week. Colonoscopy at [facility] in 2017- planned repeat 22yrs.       History Patient Active Problem List   Diagnosis Date Noted   Hypothyroidism (acquired) 07/03/2019   Vitamin D deficiency disease 07/03/2019   Hypertension 07/03/2019   Menopausal and female climacteric states 07/03/2019   Past Medical History:  Diagnosis Date   Allergy    History of chickenpox    Hypertension    Thyroid disease    HYPOTHYROID, THYROID NODULE   Past Surgical History:  Procedure Laterality Date   MOUTH SURGERY     wisdom teeth   TUBAL LIGATION     Allergies  Allergen Reactions   Biaxin [Clarithromycin] Nausea And Vomiting   Prior to Admission medications  Medication Sig Start Date End Date Taking? Authorizing Provider  levothyroxine (SYNTHROID, LEVOTHROID) 88 MCG tablet Take 75 mcg by mouth daily before breakfast.    Yes [provider]   Social History   Socioeconomic History   Marital status: Married    Spouse name: Not on file   Number of children: Not on file   Years of education: Not on file   Highest education level: Not on file  Occupational History   Occupation: Preschool Teacher  Tobacco Use   Smoking status: Never   Smokeless tobacco: Never  Vaping Use   Vaping Use: Never used  Substance and Sexual Activity   Alcohol use: No    Alcohol/week: 0.0 standard drinks   Drug use: No   Sexual activity: Yes    Partners: Male    Birth control/protection: Surgical    Comment: BTL-1st intercourse 22 yo-1 partner  Other Topics Concern   Not on file   Social History Narrative   Not on file   Social Determinants of Health   Financial Resource Strain: Not on file  Food Insecurity: Not on file  Transportation Needs: Not on file  Physical Activity: Not on file  Stress: Not on file  Social Connections: Not on file  Intimate Partner Violence: Not on file    Review of Systems  Constitutional:  Negative for fatigue and unexpected weight change.  Respiratory:  Negative for chest tightness and shortness of breath.   Cardiovascular:  Negative for chest pain, palpitations and leg swelling.  Gastrointestinal:  Negative for abdominal pain and blood in stool.  Neurological:  Negative for dizziness, syncope, light-headedness and headaches.    Objective:   Vitals:   06/03/21 0927  BP: 124/66  Pulse: 88  Resp: 16  Temp: 98.1 F (36.7 C)  TempSrc: Temporal  SpO2: 95%  Weight: 153 lb 3.2 oz (69.5 kg)  Height: 5\' 4"  (1.626 m)     Physical Exam Vitals reviewed.  Constitutional:      Appearance: Normal appearance. She is well-developed.  HENT:     Head: Normocephalic and atraumatic.  Eyes:     Conjunctiva/sclera: Conjunctivae normal.     Pupils: Pupils are equal, round, and reactive to light.  Neck:     Vascular: No carotid bruit.  Cardiovascular:     Rate and Rhythm: Normal rate and regular rhythm.     Heart sounds: Normal heart sounds.  Pulmonary:     Effort: Pulmonary effort is normal.     Breath sounds: Normal breath sounds.  Abdominal:     Palpations: Abdomen is soft. There is no pulsatile mass.     Tenderness: There is no abdominal tenderness.  Musculoskeletal:     Right lower leg: No edema.     Left lower leg: No edema.  Skin:    General: Skin is warm and dry.  Neurological:     Mental Status: She is alert and oriented to person, place, and time.  Psychiatric:        Mood and Affect: Mood normal.        Behavior: Behavior normal.  38 minutes spent during visit, including chart review, GYN note review,  counseling with FH of MI, and assimilation of information, exam, discussion of plan, and chart completion.    Assessment & Plan:  Joyce Burns is a 60 y.o. female . Hyperlipidemia, unspecified hyperlipidemia type - Plan: CT CARDIAC SCORING (SELF PAY ONLY) Family history of cardiovascular disease - Plan: CT CARDIAC SCORING (SELF  PAY ONLY)  -Elevation in readings since December, with some decrease in exercise in that time, but overall commended on diet and restart of exercise.  With family history of MI in her father will check coronary calcium scoring although low 10-year ASCVD risk score.  Hold on statin for now.  Plan for recheck in 3 months.  Hypothyroidism, unspecified type  -Stable by report, managed by endocrinology.  Health maintenance reviewed, plans on second COVID booster and will return next week for Shingrix.  No orders of the defined types were placed in this encounter.  Patient Instructions  I will order coronary calcium scoring to help decide on cholesterol medication. Keep up the good work with diet, exercise.  We can plan on follow-up in 3 months for repeat labs.  If anything changes based on the coronary calcium scoring, can certainly have a follow-up sooner. Keep follow up with Dr. Talmage Nap as planned.  Thanks for coming in today.  Take care.      Signed,   Meredith Staggers, MD [facility], [facility] 06/03/21 10:43 AM

## 2021-06-03 NOTE — Patient Instructions (Addendum)
I will order coronary calcium scoring to help decide on cholesterol medication. Keep up the good work with diet, exercise.  We can plan on follow-up in 3 months for repeat labs.  If anything changes based on the coronary calcium scoring, can certainly have a follow-up sooner. Keep follow up with Dr. Talmage Nap as planned.  Thanks for coming in today.  Take care.

## 2021-06-08 ENCOUNTER — Ambulatory Visit (INDEPENDENT_AMBULATORY_CARE_PROVIDER_SITE_OTHER): Payer: Managed Care, Other (non HMO) | Admitting: Family Medicine

## 2021-06-08 ENCOUNTER — Other Ambulatory Visit: Payer: Self-pay

## 2021-06-08 DIAGNOSIS — Z23 Encounter for immunization: Secondary | ICD-10-CM | POA: Diagnosis not present

## 2021-06-08 NOTE — Progress Notes (Signed)
Patient here today for second shingles vaccine. First was not tolerated well but pt reports she is willing to withstand side effects again to gain immunity. Vaccine administered Rt arm no concerns immediatly pt advised to call back if any further concerns or questions.

## 2021-06-15 ENCOUNTER — Other Ambulatory Visit: Payer: Self-pay

## 2021-06-15 ENCOUNTER — Ambulatory Visit
Admission: RE | Admit: 2021-06-15 | Discharge: 2021-06-15 | Disposition: A | Discharge status: Home / Self Care | Payer: Managed Care, Other (non HMO) | Source: Ambulatory Visit | Attending: Nurse Practitioner | Admitting: Nurse Practitioner

## 2021-06-15 ENCOUNTER — Encounter: Payer: Self-pay | Admitting: Nurse Practitioner

## 2021-06-15 DIAGNOSIS — N63 Unspecified lump in unspecified breast: Secondary | ICD-10-CM

## 2021-06-17 ENCOUNTER — Other Ambulatory Visit: Payer: Self-pay

## 2021-06-17 ENCOUNTER — Ambulatory Visit (INDEPENDENT_AMBULATORY_CARE_PROVIDER_SITE_OTHER)
Admission: RE | Admit: 2021-06-17 | Discharge: 2021-06-17 | Disposition: A | Discharge status: Home / Self Care | Payer: Self-pay | Source: Ambulatory Visit | Attending: Family Medicine | Admitting: Family Medicine

## 2021-06-17 DIAGNOSIS — Z8249 Family history of ischemic heart disease and other diseases of the circulatory system: Secondary | ICD-10-CM

## 2021-06-17 DIAGNOSIS — E785 Hyperlipidemia, unspecified: Secondary | ICD-10-CM

## 2021-06-18 ENCOUNTER — Encounter: Payer: Self-pay | Admitting: Family Medicine

## 2021-06-25 ENCOUNTER — Telehealth: Payer: Self-pay

## 2021-06-25 DIAGNOSIS — Z8249 Family history of ischemic heart disease and other diseases of the circulatory system: Secondary | ICD-10-CM

## 2021-06-25 DIAGNOSIS — E785 Hyperlipidemia, unspecified: Secondary | ICD-10-CM

## 2021-06-25 MED ORDER — ROSUVASTATIN CALCIUM 5 MG PO TABS: 5.0000 mg | ORAL_TABLET | Freq: Every day | ORAL | 1 refills | Status: DC

## 2021-06-25 NOTE — Telephone Encounter (Signed)
Based off the result that pt got back on her Cholesterol was Dr Neva Seat going to put her on Cholesterol Meds. Dr Neva Seat said if he has not reached out to her within a week to get in touch with him.  Pt call back [phone]

## 2021-06-25 NOTE — Telephone Encounter (Signed)
See my chart message

## 2021-09-03 ENCOUNTER — Other Ambulatory Visit: Payer: Self-pay

## 2021-09-03 ENCOUNTER — Encounter: Payer: Self-pay | Admitting: Family Medicine

## 2021-09-03 ENCOUNTER — Ambulatory Visit (INDEPENDENT_AMBULATORY_CARE_PROVIDER_SITE_OTHER): Payer: Managed Care, Other (non HMO) | Admitting: Family Medicine

## 2021-09-03 VITALS — BP 136/64 | HR 81 | Temp 98.0°F | Resp 16 | Ht 64.0 in | Wt 158.8 lb

## 2021-09-03 DIAGNOSIS — Z8249 Family history of ischemic heart disease and other diseases of the circulatory system: Secondary | ICD-10-CM

## 2021-09-03 DIAGNOSIS — E785 Hyperlipidemia, unspecified: Secondary | ICD-10-CM

## 2021-09-03 NOTE — Patient Instructions (Addendum)
Lab only visit in the next 2 weeks. No change in crestor for now.  Thanks for coming in today.

## 2021-09-03 NOTE — Progress Notes (Signed)
Subjective:  Patient ID: Joyce Burns, female    DOB: 1961-05-20  Age: 60 y.o. MRN: [id number]  CC:  Chief Complaint  Patient presents with   Hyperlipidemia    Pt reports here to check cholesterol medications and check levels, pt reports no issues     HPI Joyce Burns presents for   Hyperlipidemia: Crestor 5mg  qd since 06/25/21.  No new myalgias/side effects.  Not fasting today.  CCS of 0 on 06/17/21. FH of CAD - MI at 60yo.   Lab Results  Component Value Date   CHOL 269 (H) 05/21/2021   HDL 68 05/21/2021   LDLCALC 171 (H) 05/21/2021   TRIG 157 (H) 05/21/2021   CHOLHDL 4.0 05/21/2021   Lab Results  Component Value Date   ALT 17 05/21/2021   AST 20 05/21/2021   ALKPHOS 70 07/04/2014   BILITOT 0.7 05/21/2021   Hypothyroidism managed by Dr. 07/22/2021.   Had covid vaccine and bivalent booster.  Immunization History  Administered Date(s) Administered   Influenza Inj Mdck Quad Pf 07/29/2018   Influenza Split 09/28/2011   Influenza,inj,Quad PF,6+ Mos 08/14/2015, 08/16/2016, 07/09/2017, 08/02/2019   Influenza-Unspecified 08/24/2021   Pfizer Covid-19 Vaccine Bivalent Booster 19yrs & up 08/24/2021   Tdap 12/01/2018   Zoster Recombinat (Shingrix) 05/27/2020, 06/08/2021    History Patient Active Problem List   Diagnosis Date Noted   Hypothyroidism (acquired) 07/03/2019   Vitamin D deficiency disease 07/03/2019   Hypertension 07/03/2019   Menopausal and female climacteric states 07/03/2019   Past Medical History:  Diagnosis Date   Allergy    History of chickenpox    Hypertension    Thyroid disease    HYPOTHYROID, THYROID NODULE   Past Surgical History:  Procedure Laterality Date   MOUTH SURGERY     wisdom teeth   TUBAL LIGATION     Allergies  Allergen Reactions   Biaxin [Clarithromycin] Nausea And Vomiting   Prior to Admission medications   Medication Sig Start Date End Date Taking? Authorizing Provider  levothyroxine (SYNTHROID, LEVOTHROID) 88 MCG  tablet Take 75 mcg by mouth daily before breakfast.    Yes [provider]  rosuvastatin (CRESTOR) 5 MG tablet Take 1 tablet (5 mg total) by mouth daily. 06/25/21  Yes 08/25/21, MD   Social History   Socioeconomic History   Marital status: Married    Spouse name: Not on file   Number of children: Not on file   Years of education: Not on file   Highest education level: Not on file  Occupational History   Occupation: Preschool Teacher  Tobacco Use   Smoking status: Never   Smokeless tobacco: Never  Vaping Use   Vaping Use: Never used  Substance and Sexual Activity   Alcohol use: No    Alcohol/week: 0.0 standard drinks   Drug use: No   Sexual activity: Yes    Partners: Male    Birth control/protection: Surgical    Comment: BTL-1st intercourse 22 yo-1 partner  Other Topics Concern   Not on file  Social History Narrative   Not on file   Social Determinants of Health   Financial Resource Strain: Not on file  Food Insecurity: Not on file  Transportation Needs: Not on file  Physical Activity: Not on file  Stress: Not on file  Social Connections: Not on file  Intimate Partner Violence: Not on file    Review of Systems  Constitutional:  Negative for fatigue and unexpected weight change.  Respiratory:  Negative for chest tightness and shortness of breath.   Cardiovascular:  Negative for chest pain, palpitations and leg swelling.  Gastrointestinal:  Negative for abdominal pain and blood in stool.  Neurological:  Negative for dizziness, syncope, light-headedness and headaches.    Objective:   Vitals:   09/03/21 1448  BP: 136/64  Pulse: 81  Resp: 16  Temp: 98 F (36.7 C)  TempSrc: Temporal  SpO2: 96%  Weight: 158 lb 12.8 oz (72 kg)  Height: 5\' 4"  (1.626 m)     Physical Exam Vitals reviewed.  Constitutional:      Appearance: Normal appearance. She is well-developed.  HENT:     Head: Normocephalic and atraumatic.  Eyes:     Conjunctiva/sclera:  Conjunctivae normal.     Pupils: Pupils are equal, round, and reactive to light.  Neck:     Vascular: No carotid bruit.  Cardiovascular:     Rate and Rhythm: Normal rate and regular rhythm.     Heart sounds: Normal heart sounds.  Pulmonary:     Effort: Pulmonary effort is normal.     Breath sounds: Normal breath sounds.  Abdominal:     Palpations: Abdomen is soft. There is no pulsatile mass.     Tenderness: There is no abdominal tenderness.  Musculoskeletal:     Right lower leg: No edema.     Left lower leg: No edema.  Skin:    General: Skin is warm and dry.  Neurological:     Mental Status: She is alert and oriented to person, place, and time.  Psychiatric:        Mood and Affect: Mood normal.        Behavior: Behavior normal.       Assessment & Plan:  Joyce Burns is a 60 y.o. female . Hyperlipidemia, unspecified hyperlipidemia type  Family history of cardiovascular disease Tolerating low-dose statin, plan for lab only visit next few weeks, consider higher dose of Crestor depending on LDL reading.  Reassuring coronary calcium score, but does have family history of cardiac disease as above.  No orders of the defined types were placed in this encounter.  Patient Instructions  Lab only visit in the next 2 weeks. No change in crestor for now.  Thanks for coming in today.      Signed,   41, MD [facility], [facility] 09/03/21 6:26 PM

## 2021-09-14 ENCOUNTER — Other Ambulatory Visit: Payer: Managed Care, Other (non HMO)

## 2021-09-18 ENCOUNTER — Other Ambulatory Visit (INDEPENDENT_AMBULATORY_CARE_PROVIDER_SITE_OTHER): Payer: Managed Care, Other (non HMO)

## 2021-09-18 ENCOUNTER — Other Ambulatory Visit: Payer: Self-pay | Admitting: Family Medicine

## 2021-09-18 ENCOUNTER — Other Ambulatory Visit: Payer: Self-pay

## 2021-09-18 DIAGNOSIS — E785 Hyperlipidemia, unspecified: Secondary | ICD-10-CM

## 2021-09-18 LAB — COMPREHENSIVE METABOLIC PANEL
ALT: 20 U/L (ref 0–35)
AST: 22 U/L (ref 0–37)
Albumin: 4.7 g/dL (ref 3.5–5.2)
Alkaline Phosphatase: 84 U/L (ref 39–117)
BUN: 21 mg/dL (ref 6–23)
CO2: 30 mEq/L (ref 19–32)
Calcium: 10 mg/dL (ref 8.4–10.5)
Chloride: 104 mEq/L (ref 96–112)
Creatinine, Ser: 0.99 mg/dL (ref 0.40–1.20)
GFR: 62.75 mL/min (ref >60.00)
Glucose, Bld: 97 mg/dL (ref 70–99)
Potassium: 5.5 mEq/L — ABNORMAL HIGH (ref 3.5–5.1)
Sodium: 140 mEq/L (ref 135–145)
Total Bilirubin: 0.6 mg/dL (ref 0.2–1.2)
Total Protein: 8.1 g/dL (ref 6.0–8.3)

## 2021-09-18 LAB — LIPID PANEL
Cholesterol: 154 mg/dL (ref 0–200)
HDL: 67.7 mg/dL (ref >39.00)
LDL Cholesterol: 67 mg/dL (ref 0–99)
NonHDL: 86.72
Total CHOL/HDL Ratio: 2
Triglycerides: 97 mg/dL (ref 0.0–149.0)
VLDL: 19.4 mg/dL (ref 0.0–40.0)

## 2021-09-29 ENCOUNTER — Other Ambulatory Visit: Payer: Self-pay | Admitting: Family Medicine

## 2021-09-29 ENCOUNTER — Encounter: Payer: Self-pay | Admitting: Family Medicine

## 2021-09-29 DIAGNOSIS — E875 Hyperkalemia: Secondary | ICD-10-CM

## 2021-09-29 NOTE — Progress Notes (Signed)
See lab notes

## 2021-11-17 ENCOUNTER — Other Ambulatory Visit: Payer: Self-pay | Admitting: Nurse Practitioner

## 2021-11-17 DIAGNOSIS — N6320 Unspecified lump in the left breast, unspecified quadrant: Secondary | ICD-10-CM

## 2021-11-17 DIAGNOSIS — N6310 Unspecified lump in the right breast, unspecified quadrant: Secondary | ICD-10-CM

## 2021-12-20 ENCOUNTER — Other Ambulatory Visit: Payer: Self-pay | Admitting: Family Medicine

## 2021-12-20 DIAGNOSIS — Z8249 Family history of ischemic heart disease and other diseases of the circulatory system: Secondary | ICD-10-CM

## 2021-12-20 DIAGNOSIS — E785 Hyperlipidemia, unspecified: Secondary | ICD-10-CM

## 2021-12-23 ENCOUNTER — Ambulatory Visit
Admission: RE | Admit: 2021-12-23 | Discharge: 2021-12-23 | Disposition: A | Discharge status: Home / Self Care | Payer: Managed Care, Other (non HMO) | Source: Ambulatory Visit | Attending: Nurse Practitioner | Admitting: Nurse Practitioner

## 2021-12-23 ENCOUNTER — Other Ambulatory Visit: Payer: Self-pay

## 2021-12-23 DIAGNOSIS — N6320 Unspecified lump in the left breast, unspecified quadrant: Secondary | ICD-10-CM

## 2022-05-27 ENCOUNTER — Ambulatory Visit: Payer: Managed Care, Other (non HMO) | Admitting: Nurse Practitioner

## 2022-06-07 ENCOUNTER — Ambulatory Visit (INDEPENDENT_AMBULATORY_CARE_PROVIDER_SITE_OTHER): Payer: Managed Care, Other (non HMO) | Admitting: Nurse Practitioner

## 2022-06-07 ENCOUNTER — Encounter: Payer: Self-pay | Admitting: Nurse Practitioner

## 2022-06-07 VITALS — BP 132/88 | HR 108 | Ht 64.0 in | Wt 164.0 lb

## 2022-06-07 DIAGNOSIS — Z780 Asymptomatic menopausal state: Secondary | ICD-10-CM | POA: Diagnosis not present

## 2022-06-07 DIAGNOSIS — Z01419 Encounter for gynecological examination (general) (routine) without abnormal findings: Secondary | ICD-10-CM | POA: Diagnosis not present

## 2022-06-07 DIAGNOSIS — E785 Hyperlipidemia, unspecified: Secondary | ICD-10-CM

## 2022-06-07 NOTE — Progress Notes (Signed)
Joyce Burns 02-15-1961 [id number]   History:  61 y.o. G2 P2 presents for annual exam. Postmenopausal - no HRT, no bleeding. Normal pap and mammogram history. Hypothyroidism managed by endocrinology, HLD managed by PCP. Needs refills on Lipitor while she finds new PCP. History of Vitamin D deficiency.   Gynecologic History No LMP recorded. Patient is postmenopausal.   Contraception/Family planning: post menopausal status Sexually active: Yes  Health Maintenance Last Pap: 11/29/2018. Results were: Normal, 5-year repeat Last mammogram: 12/23/2021. Results were: Stable left mass at 12 o'clock, new cluster near nipple, insignificant Last colonoscopy: 2017. Results were: Normal, 10-year recall Last Dexa: Not indicated  Past medical history, past surgical history, family history and social history were all reviewed and documented in the EPIC chart. 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 Daughter lives in [place], Comptroller, has 4 mo daughter. 3 yo son.   ROS:  A ROS was performed and pertinent positives and negatives are included.  Exam:  Vitals:   06/07/22 1357  BP: 132/88  Pulse: (!) 108  SpO2: 96%  Weight: 164 lb (74.4 kg)  Height: 5\' 4"  (1.626 m)    Body mass index is 28.15 kg/m.  General appearance:  Normal Thyroid:  Symmetrical, normal in size, without palpable masses or nodularity. Respiratory  Auscultation:  Clear without wheezing or rhonchi Cardiovascular  Auscultation:  Regular rate, without rubs, murmurs or gallops  Edema/varicosities:  Not grossly evident Abdominal  Soft,nontender, without masses, guarding or rebound.  Liver/spleen:  No organomegaly noted  Hernia:  None appreciated  Skin  Inspection:  Grossly normal   Breasts: Examined lying and sitting.   Right: Without masses, retractions, discharge or axillary adenopathy.   Left: Without masses, retractions, discharge or axillary adenopathy. Genitourinary    Inguinal/mons:  Normal without inguinal adenopathy  External genitalia:  Normal appearing vulva with no masses, tenderness, or lesions  BUS/Urethra/Skene's glands:  Normal  Vagina:  Normal appearing with normal color and discharge, no lesions  Cervix:  Normal appearing without discharge or lesions  Uterus:  Normal in size, shape and contour.  Midline and mobile, nontender  Adnexa/parametria:     Rt: Normal in size, without masses or tenderness.   Lt: Normal in size, without masses or tenderness.  Anus and perineum: Normal  Digital rectal exam: Normal sphincter tone without palpated masses or tenderness  Patient informed chaperone available to be present for breast and pelvic exam. Patient has requested no chaperone to be present. Patient has been advised what will be completed during breast and pelvic exam.   Assessment/Plan:  61 y.o. G2 P2 for annual exam without GYN complaints.   Well female exam with routine gynecological exam - Plan: CBC with Differential/Platelet, Comprehensive metabolic panel. Labs today per request. PCP has been managing prior. Education provided on SBEs, importance of preventative screenings, current guidelines, high calcium diet, regular exercise, and multivitamin daily.   Hyperlipidemia, unspecified hyperlipidemia type - Plan: Lipid panel. PCP has been managing but she plans to find new one and needs refills now.   Postmenopausal - no HRT, no bleeding.   Screening for cervical cancer - Normal Pap history. Will repeat in 5-year per guidelines.   Screening for breast cancer - Stable left mass at 12 o'clock, new cluster near nipple, insignificant. Recommendations to repeat in 1 year with ultrasound. Normal breast exam today.  Screening for colon cancer - 2017 colonoscopy. Will repeat at GI's recommended interval.   Screening for osteoporosis - Average risk. Will  begin bone density testing at age 63.   Return in 1 year for annual.      Olivia Mackie  [facility], 2:22 PM 06/07/2022

## 2022-06-08 ENCOUNTER — Encounter: Payer: Self-pay | Admitting: Nurse Practitioner

## 2022-06-08 ENCOUNTER — Other Ambulatory Visit: Payer: Self-pay | Admitting: Nurse Practitioner

## 2022-06-08 DIAGNOSIS — Z8249 Family history of ischemic heart disease and other diseases of the circulatory system: Secondary | ICD-10-CM

## 2022-06-08 DIAGNOSIS — E785 Hyperlipidemia, unspecified: Secondary | ICD-10-CM

## 2022-06-08 LAB — COMPREHENSIVE METABOLIC PANEL
AG Ratio: 1.4 (calc) (ref 1.0–2.5)
ALT: 16 U/L (ref 6–29)
AST: 19 U/L (ref 10–35)
Albumin: 4.5 g/dL (ref 3.6–5.1)
Alkaline phosphatase (APISO): 94 U/L (ref 37–153)
BUN: 10 mg/dL (ref 7–25)
CO2: 24 mmol/L (ref 20–32)
Calcium: 9.7 mg/dL (ref 8.6–10.4)
Chloride: 104 mmol/L (ref 98–110)
Creat: 0.77 mg/dL (ref 0.50–1.03)
Globulin: 3.2 g/dL (calc) (ref 1.9–3.7)
Glucose, Bld: 97 mg/dL (ref 65–99)
Potassium: 3.9 mmol/L (ref 3.5–5.3)
Sodium: 139 mmol/L (ref 135–146)
Total Bilirubin: 0.6 mg/dL (ref 0.2–1.2)
Total Protein: 7.7 g/dL (ref 6.1–8.1)

## 2022-06-08 LAB — CBC WITH DIFFERENTIAL/PLATELET
Absolute Monocytes: 482 cells/uL (ref 200–950)
Basophils Absolute: 43 cells/uL (ref 0–200)
Basophils Relative: 0.7 %
Eosinophils Absolute: 79 cells/uL (ref 15–500)
Eosinophils Relative: 1.3 %
HCT: 42.6 % (ref 35.0–45.0)
Hemoglobin: 14 g/dL (ref 11.7–15.5)
Lymphs Abs: 1671 cells/uL (ref 850–3900)
MCH: 29.6 pg (ref 27.0–33.0)
MCHC: 32.9 g/dL (ref 32.0–36.0)
MCV: 90.1 fL (ref 80.0–100.0)
MPV: 11.9 fL (ref 7.5–12.5)
Monocytes Relative: 7.9 %
Neutro Abs: 3825 cells/uL (ref 1500–7800)
Neutrophils Relative %: 62.7 %
Platelets: 225 10*3/uL (ref 140–400)
RBC: 4.73 10*6/uL (ref 3.80–5.10)
RDW: 12.6 % (ref 11.0–15.0)
Total Lymphocyte: 27.4 %
WBC: 6.1 10*3/uL (ref 3.8–10.8)

## 2022-06-08 LAB — LIPID PANEL
Cholesterol: 142 mg/dL (ref <200)
HDL: 66 mg/dL (ref >=50)
LDL Cholesterol (Calc): 54 mg/dL (calc)
Non-HDL Cholesterol (Calc): 76 mg/dL (calc) (ref <130)
Total CHOL/HDL Ratio: 2.2 (calc) (ref <5.0)
Triglycerides: 140 mg/dL (ref <150)

## 2022-06-08 MED ORDER — ROSUVASTATIN CALCIUM 5 MG PO TABS: 5.0000 mg | ORAL_TABLET | Freq: Every day | ORAL | 1 refills | Status: DC

## 2022-11-22 ENCOUNTER — Other Ambulatory Visit: Payer: Self-pay | Admitting: Nurse Practitioner

## 2022-11-22 DIAGNOSIS — R928 Other abnormal and inconclusive findings on diagnostic imaging of breast: Secondary | ICD-10-CM

## 2022-11-28 ENCOUNTER — Other Ambulatory Visit: Payer: Self-pay | Admitting: Nurse Practitioner

## 2022-11-28 DIAGNOSIS — Z8249 Family history of ischemic heart disease and other diseases of the circulatory system: Secondary | ICD-10-CM

## 2022-11-28 DIAGNOSIS — E785 Hyperlipidemia, unspecified: Secondary | ICD-10-CM

## 2022-11-30 NOTE — Telephone Encounter (Signed)
Med refill request: Crestor 5 mg tab PO daily Last AEX: 06/07/22/ TW Next AEX: Not scheduled  Last MMG (if hormonal med) N/A  Refill authorized: Please Advise?

## 2022-12-24 ENCOUNTER — Ambulatory Visit
Admission: RE | Admit: 2022-12-24 | Discharge: 2022-12-24 | Disposition: A | Discharge status: Home / Self Care | Payer: Managed Care, Other (non HMO) | Source: Ambulatory Visit | Attending: Nurse Practitioner | Admitting: Nurse Practitioner

## 2022-12-24 DIAGNOSIS — R928 Other abnormal and inconclusive findings on diagnostic imaging of breast: Secondary | ICD-10-CM

## 2023-06-09 ENCOUNTER — Ambulatory Visit: Payer: Managed Care, Other (non HMO) | Admitting: Nurse Practitioner

## 2023-06-09 ENCOUNTER — Encounter: Payer: Self-pay | Admitting: Nurse Practitioner

## 2023-06-09 VITALS — BP 134/86 | HR 77 | Ht 64.0 in | Wt 177.8 lb

## 2023-06-09 DIAGNOSIS — Z780 Asymptomatic menopausal state: Secondary | ICD-10-CM

## 2023-06-09 DIAGNOSIS — E785 Hyperlipidemia, unspecified: Secondary | ICD-10-CM

## 2023-06-09 DIAGNOSIS — Z01419 Encounter for gynecological examination (general) (routine) without abnormal findings: Secondary | ICD-10-CM

## 2023-06-09 NOTE — Progress Notes (Signed)
   KRYSTIANA FORNES [birth year] [id number]   History:  62 y.o. G2 P2 presents for annual exam. Postmenopausal - no HRT, no bleeding. Normal pap and mammogram history. Hypothyroidism, HLD managed by PCP.   Gynecologic History No LMP recorded. Patient is postmenopausal.   Contraception/Family planning: post menopausal status Sexually active: Yes  Health Maintenance Last Pap: 05/21/2021. Results were: Normal neg HPV, 5-year repeat Last mammogram: 12/24/2022 (diagnostic). Results were: Stable left mass at 12 o'clock, cluster of cysts Last colonoscopy: 2017. Results were: Normal, 10-year recall Last Dexa: Not indicated  Past medical history, past surgical history, family history and social history were all reviewed and documented in the EPIC chart. 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 Daughter recently moved here from [facility], Comptroller, has 17 mo daughter. 11 yo son.   ROS:  A ROS was performed and pertinent positives and negatives are included.  Exam:  Vitals:   06/09/23 1326  BP: 134/86  Pulse: 77  SpO2: 100%  Weight: 177 lb 12.8 oz (80.6 kg)  Height: 5\' 4"  (1.626 m)     Body mass index is 30.52 kg/m.  General appearance:  Normal Thyroid:  Symmetrical, normal in size, without palpable masses or nodularity. Respiratory  Auscultation:  Clear without wheezing or rhonchi Cardiovascular  Auscultation:  Regular rate, without rubs, murmurs or gallops  Edema/varicosities:  Not grossly evident Abdominal  Soft,nontender, without masses, guarding or rebound.  Liver/spleen:  No organomegaly noted  Hernia:  None appreciated  Skin  Inspection:  Grossly normal   Breasts: Examined lying and sitting.   Right: Without masses, retractions, discharge or axillary adenopathy.   Left: Without masses, retractions, discharge or axillary adenopathy. Genitourinary   Inguinal/mons:  Normal without inguinal  adenopathy  External genitalia:  Normal appearing vulva with no masses, tenderness, or lesions  BUS/Urethra/Skene's glands:  Normal  Vagina:  Normal appearing with normal color and discharge, no lesions  Cervix:  Normal appearing without discharge or lesions  Uterus:  Normal in size, shape and contour.  Midline and mobile, nontender  Adnexa/parametria:     Rt: Normal in size, without masses or tenderness.   Lt: Normal in size, without masses or tenderness.  Anus and perineum: Normal  Digital rectal exam: Not indicated  Patient informed chaperone available to be present for breast and pelvic exam. Patient has requested no chaperone to be present. Patient has been advised what will be completed during breast and pelvic exam.   Assessment/Plan:  62 y.o. G2 P2 for annual exam without GYN complaints.   Well female exam with routine gynecological exam - Education provided on SBEs, importance of preventative screenings, current guidelines, high calcium diet, regular exercise, and multivitamin daily. Recent labs with PCP.   Postmenopausal - no HRT, no bleeding.   Screening for cervical cancer - Normal Pap history. Will repeat in 5-year per guidelines.   Screening for breast cancer - Stable left mass at 12 o'clock, cluster of cysts. Return to annual screening mammograms as recommended. Normal breast exam today.  Screening for colon cancer - 2017 colonoscopy. Will repeat at GI's recommended interval.   Screening for osteoporosis - Average risk. Will begin bone density testing at age 89.   Return in 1 year for annual.      Olivia Mackie [facility], 1:53 PM 06/09/2023

## 2023-11-29 ENCOUNTER — Other Ambulatory Visit: Payer: Self-pay | Admitting: Nurse Practitioner

## 2023-11-29 DIAGNOSIS — Z1231 Encounter for screening mammogram for malignant neoplasm of breast: Secondary | ICD-10-CM

## 2023-12-27 ENCOUNTER — Ambulatory Visit
Admission: RE | Admit: 2023-12-27 | Discharge: 2023-12-27 | Disposition: A | Discharge status: Home / Self Care | Payer: Managed Care, Other (non HMO) | Source: Ambulatory Visit | Attending: Nurse Practitioner

## 2023-12-27 DIAGNOSIS — Z1231 Encounter for screening mammogram for malignant neoplasm of breast: Secondary | ICD-10-CM

## 2024-06-12 ENCOUNTER — Ambulatory Visit: Payer: Managed Care, Other (non HMO) | Admitting: Nurse Practitioner

## 2024-06-20 ENCOUNTER — Ambulatory Visit: Payer: Managed Care, Other (non HMO) | Admitting: Nurse Practitioner

## 2024-07-24 ENCOUNTER — Ambulatory Visit (INDEPENDENT_AMBULATORY_CARE_PROVIDER_SITE_OTHER): Admitting: Nurse Practitioner

## 2024-07-24 ENCOUNTER — Encounter: Payer: Self-pay | Admitting: Nurse Practitioner

## 2024-07-24 VITALS — BP 140/90 | Resp 16 | Ht 63.75 in | Wt 170.0 lb

## 2024-07-24 DIAGNOSIS — R030 Elevated blood-pressure reading, without diagnosis of hypertension: Secondary | ICD-10-CM

## 2024-07-24 DIAGNOSIS — Z01419 Encounter for gynecological examination (general) (routine) without abnormal findings: Secondary | ICD-10-CM

## 2024-07-24 DIAGNOSIS — Z1331 Encounter for screening for depression: Secondary | ICD-10-CM | POA: Diagnosis not present

## 2024-07-24 DIAGNOSIS — Z780 Asymptomatic menopausal state: Secondary | ICD-10-CM | POA: Diagnosis not present

## 2024-07-24 NOTE — Progress Notes (Signed)
 Joyce Burns 03-Jul-1961 [id number]   History:  63 y.o. G2 P2 presents for annual exam. Postmenopausal - no HRT, no bleeding. Normal pap and mammogram history. Hypothyroidism, HLD managed by PCP.   Gynecologic History No LMP recorded. Patient is postmenopausal.   Contraception/Family planning: post menopausal status Sexually active: Yes  Health Maintenance Last Pap: 05/21/2021. Results were: Normal neg HPV Last mammogram: 12/27/2023. Results were: Normal Last colonoscopy: 2017. Results were: Normal, 10-year recall Last Dexa: Not indicated     07/24/2024    2:25 PM  Depression screen PHQ 2/9  Decreased Interest 0  Down, Depressed, Hopeless 0  PHQ - 2 Score 0     Past medical history, past surgical history, family history and social history were all reviewed and documented in the EPIC chart. 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 Daughter is Comptroller, loca, has 2 yo daughter, 4 mo son. 44 yo son, local.   ROS:  A ROS was performed and pertinent positives and negatives are included.  Exam:  Vitals:   07/24/24 1422 07/24/24 1457  BP: (!) 140/90 (!) 140/90  Resp: 16   SpO2: 99%   Weight: 170 lb (77.1 kg)   Height: 5' 3.75 (1.619 m)       Body mass index is 29.41 kg/m.  General appearance:  Normal Thyroid :  Symmetrical, normal in size, without palpable masses or nodularity. Respiratory  Auscultation:  Clear without wheezing or rhonchi Cardiovascular  Auscultation:  Regular rate, without rubs, murmurs or gallops  Edema/varicosities:  Not grossly evident Abdominal  Soft,nontender, without masses, guarding or rebound.  Liver/spleen:  No organomegaly noted  Hernia:  None appreciated  Skin  Inspection:  Grossly normal   Breasts: Examined lying and sitting.   Right: Without masses, retractions, discharge or axillary adenopathy.   Left: Without masses, retractions, discharge or axillary adenopathy. Pelvic: External genitalia:  no  lesions              Urethra:  normal appearing urethra with no masses, tenderness or lesions              Bartholins and Skenes: normal                 Vagina: normal appearing vagina with normal color and discharge, no lesions. Mild atrophic changes              Cervix: no lesions Bimanual Exam:  Uterus:  no masses or tenderness              Adnexa: no mass, fullness, tenderness              Rectovaginal: Deferred              Anus:  normal, no lesions  Zada Louder, CMA present as chaperone.   Assessment/Plan:  63 y.o. G2 P2 for annual exam without GYN complaints.   Well female exam with routine gynecological exam - Education provided on SBEs, importance of preventative screenings, current guidelines, high calcium  diet, regular exercise, and multivitamin daily. Labs with PCP.   Postmenopausal - no HRT, no bleeding.   Elevated blood pressure reading without diagnosis of hypertension - BP 140/90 at beginning and end of visit. Has been on BP meds in the past but was able to discontinue after weight loss. Recommend keeping BP diary and discussing with PCP.  Screening for cervical cancer - Normal Pap history. Will repeat in 5-year per guidelines.   Screening for breast cancer - Continue annual screenings.  Normal breast exam today.  Screening for colon cancer - 2017 colonoscopy. Will repeat at GI's recommended interval.   Screening for osteoporosis - Average risk. Will begin bone density testing at age 3.   Return in about 1 year (around 07/24/2025) for Annual.      Joyce Burns [facility], 3:04 PM 07/24/2024
